# Patient Record
Sex: Female | Born: 1980 | Race: White | Hispanic: No | Marital: Single | Smoking: Current every day smoker
Health system: Southern US, Community
[De-identification: ages and names within clinical notes are randomized; demographics above are authoritative.]

## PROBLEM LIST (undated history)

## (undated) DIAGNOSIS — T7840XA Allergy, unspecified, initial encounter: Secondary | ICD-10-CM

## (undated) DIAGNOSIS — F1910 Other psychoactive substance abuse, uncomplicated: Secondary | ICD-10-CM

## (undated) DIAGNOSIS — M5481 Occipital neuralgia: Principal | ICD-10-CM

## (undated) DIAGNOSIS — F419 Anxiety disorder, unspecified: Secondary | ICD-10-CM

## (undated) HISTORY — DX: Other psychoactive substance abuse, uncomplicated: F19.10

## (undated) HISTORY — DX: Anxiety disorder, unspecified: F41.9

## (undated) HISTORY — DX: Occipital neuralgia: M54.81

## (undated) HISTORY — DX: Allergy, unspecified, initial encounter: T78.40XA

## (undated) HISTORY — PX: SKIN CANCER EXCISION: SHX779

## (undated) HISTORY — PX: CHOLECYSTECTOMY: SHX55

---

## 2001-03-18 ENCOUNTER — Other Ambulatory Visit: Admission: RE | Admit: 2001-03-18 | Discharge: 2001-03-18 | Payer: Self-pay | Admitting: Gynecology

## 2003-04-16 ENCOUNTER — Inpatient Hospital Stay (HOSPITAL_COMMUNITY): Admission: AD | Admit: 2003-04-16 | Discharge: 2003-04-16 | Payer: Self-pay | Admitting: Gynecology

## 2003-05-20 ENCOUNTER — Inpatient Hospital Stay (HOSPITAL_COMMUNITY): Admission: AD | Admit: 2003-05-20 | Discharge: 2003-05-20 | Payer: Self-pay | Admitting: Gynecology

## 2003-08-21 ENCOUNTER — Encounter (INDEPENDENT_AMBULATORY_CARE_PROVIDER_SITE_OTHER): Payer: Self-pay | Admitting: Specialist

## 2003-08-21 ENCOUNTER — Inpatient Hospital Stay (HOSPITAL_COMMUNITY): Admission: RE | Admit: 2003-08-21 | Discharge: 2003-08-24 | Payer: Self-pay | Admitting: Gynecology

## 2003-08-25 ENCOUNTER — Encounter: Admission: RE | Admit: 2003-08-25 | Discharge: 2003-09-24 | Payer: Self-pay | Admitting: Gynecology

## 2003-11-15 ENCOUNTER — Ambulatory Visit (HOSPITAL_COMMUNITY): Admission: RE | Admit: 2003-11-15 | Discharge: 2003-11-15 | Payer: Self-pay

## 2003-11-15 ENCOUNTER — Encounter (INDEPENDENT_AMBULATORY_CARE_PROVIDER_SITE_OTHER): Payer: Self-pay | Admitting: Specialist

## 2003-12-11 ENCOUNTER — Other Ambulatory Visit: Admission: RE | Admit: 2003-12-11 | Discharge: 2003-12-11 | Payer: Self-pay | Admitting: Gynecology

## 2004-04-23 ENCOUNTER — Emergency Department (HOSPITAL_COMMUNITY): Admission: EM | Admit: 2004-04-23 | Discharge: 2004-04-23 | Payer: Self-pay | Admitting: Family Medicine

## 2004-04-30 ENCOUNTER — Other Ambulatory Visit: Admission: RE | Admit: 2004-04-30 | Discharge: 2004-04-30 | Payer: Self-pay | Admitting: Gynecology

## 2004-08-15 ENCOUNTER — Other Ambulatory Visit (HOSPITAL_COMMUNITY): Admission: RE | Admit: 2004-08-15 | Discharge: 2004-09-16 | Payer: Self-pay | Admitting: Psychiatry

## 2005-01-14 ENCOUNTER — Emergency Department (HOSPITAL_COMMUNITY): Admission: EM | Admit: 2005-01-14 | Discharge: 2005-01-14 | Payer: Self-pay | Admitting: Emergency Medicine

## 2005-01-16 ENCOUNTER — Emergency Department: Payer: Self-pay | Admitting: General Practice

## 2005-03-03 ENCOUNTER — Emergency Department (HOSPITAL_COMMUNITY): Admission: EM | Admit: 2005-03-03 | Discharge: 2005-03-03 | Payer: Self-pay | Admitting: Emergency Medicine

## 2005-10-26 ENCOUNTER — Emergency Department (HOSPITAL_COMMUNITY): Admission: EM | Admit: 2005-10-26 | Discharge: 2005-10-26 | Payer: Self-pay | Admitting: Emergency Medicine

## 2005-12-17 ENCOUNTER — Emergency Department (HOSPITAL_COMMUNITY): Admission: EM | Admit: 2005-12-17 | Discharge: 2005-12-17 | Payer: Self-pay | Admitting: Family Medicine

## 2005-12-28 ENCOUNTER — Emergency Department (HOSPITAL_COMMUNITY): Admission: EM | Admit: 2005-12-28 | Discharge: 2005-12-28 | Payer: Self-pay | Admitting: Emergency Medicine

## 2006-02-01 ENCOUNTER — Emergency Department (HOSPITAL_COMMUNITY): Admission: EM | Admit: 2006-02-01 | Discharge: 2006-02-01 | Payer: Self-pay | Admitting: Family Medicine

## 2006-02-15 ENCOUNTER — Emergency Department (HOSPITAL_COMMUNITY): Admission: EM | Admit: 2006-02-15 | Discharge: 2006-02-15 | Payer: Self-pay | Admitting: Emergency Medicine

## 2006-06-10 ENCOUNTER — Emergency Department (HOSPITAL_COMMUNITY): Admission: EM | Admit: 2006-06-10 | Discharge: 2006-06-10 | Payer: Self-pay | Admitting: Emergency Medicine

## 2006-07-01 ENCOUNTER — Emergency Department: Payer: Self-pay | Admitting: General Practice

## 2006-08-02 ENCOUNTER — Emergency Department: Payer: Self-pay | Admitting: Emergency Medicine

## 2006-08-02 ENCOUNTER — Emergency Department (HOSPITAL_COMMUNITY): Admission: EM | Admit: 2006-08-02 | Discharge: 2006-08-02 | Payer: Self-pay | Admitting: Emergency Medicine

## 2007-01-21 ENCOUNTER — Ambulatory Visit: Payer: Self-pay | Admitting: Family Medicine

## 2007-01-21 LAB — CONVERTED CEMR LAB: Beta hcg, urine, semiquantitative: NEGATIVE

## 2007-02-02 ENCOUNTER — Encounter (INDEPENDENT_AMBULATORY_CARE_PROVIDER_SITE_OTHER): Payer: Self-pay | Admitting: Family Medicine

## 2010-04-16 ENCOUNTER — Other Ambulatory Visit (HOSPITAL_COMMUNITY): Payer: Self-pay | Admitting: *Deleted

## 2010-04-16 DIAGNOSIS — Z30430 Encounter for insertion of intrauterine contraceptive device: Secondary | ICD-10-CM

## 2010-04-23 ENCOUNTER — Ambulatory Visit (HOSPITAL_COMMUNITY): Admission: RE | Admit: 2010-04-23 | Payer: Self-pay | Source: Ambulatory Visit

## 2010-06-21 NOTE — Discharge Summary (Signed)
NAME:  Tamara Guerra, Tamara Guerra                         ACCOUNT NO.:  [id number]   MEDICAL RECORD NO.:  [id number]                   PATIENT TYPE:  INP   LOCATION:  9103                                 FACILITY:  [facility]   PHYSICIAN:  Ivor Costa. Farrel Gobble, M.D.              DATE OF BIRTH:  Oct 11, 1980   DATE OF ADMISSION:  08/21/2003  DATE OF DISCHARGE:  08/24/2003                                 DISCHARGE SUMMARY   PRINCIPAL DIAGNOSIS:  Thirty-seven-week pregnancy.   ADDITIONAL DIAGNOSES:  1. Breech.  2. Oligohydramnios.  3. Small for gestational age.  4. Contractions.   PRINCIPAL PROCEDURE:  Primary cesarean section.   HOSPITAL COURSE:  The patient is a 30 year old G3 P0 at 37 weeks who had  been followed in the office for SGA with low amniotic fluid who was seen in  the office on August 21, 2003 and found to have a fluid that had decreased  down to 4.  She was noted to be having contractions and the frank breech  presentation.  She had been scheduled for a cesarean section later in the  week.  It was electively done now on July 18.  The patient underwent a  primary cesarean section, low flap transverse, by Dr. Audie Box under spinal  anesthesia for delivery of a viable female, 5 pounds 14 ounces, with Apgars  of 8 and 9.  Postoperative course was unremarkable.  By postoperative day #3  the patient was tolerating a regular diet, she was ready for discharge, her  breastfeeding was going well, and she was without any complaints.  Her  abdomen was soft and nontender.  Her incision was clean and intact with  Steri-Strips.  Her uterus was firm, nontender, and below the umbilicus.  Her  postoperative labs:  Hemoglobin was 10.1 with a hematocrit of 29.9.   DISCHARGE CONDITION:  Stable.   The patient was discharged home with instructions to follow up in the office  6 weeks postpartum, no driving for the first 2 weeks.  She will take over-  the-counter Motrin.  Lortab 5 one to two q.4h. p.r.n. pain was  called into  the [facility].                                               [place] H. Farrel Gobble, M.D.    THL/MEDQ  D:  08/24/2003  T:  08/24/2003  Job:  [id number]

## 2010-06-21 NOTE — Op Note (Signed)
NAME:  Tamara Guerra, Tamara Guerra               ACCOUNT NO.:  [id number]   MEDICAL RECORD NO.:  [id number]          PATIENT TYPE:  AMB   LOCATION:  DAY                          FACILITY:  [facility]   PHYSICIAN:  Lorre Munroe., M.D.DATE OF BIRTH:  1980/08/21   DATE OF PROCEDURE:  11/15/2003  DATE OF DISCHARGE:                                 OPERATIVE REPORT   PREOPERATIVE DIAGNOSES:  Symptomatic cholelithiasis and chronic  cholecystitis.   POSTOPERATIVE DIAGNOSES:  Symptomatic cholelithiasis and chronic  cholecystitis.   OPERATION:  Laparoscopic cholecystectomy with operative cholangiogram.   SURGEON:  Dr. Orson Slick   ANESTHESIA:  General.   DESCRIPTION OF PROCEDURE:  After the patient was monitored and anesthetized  and had routine preparation and draping of the abdomen, I infiltrated local  anesthetic just below the umbilicus and made a short transverse incision,  dissected down through the fascia, opened it longitudinally, and bluntly  entered the peritoneum.  I then inserted a Hasson cannula which was secured  with a 0 Vicryl pursestring suture in the fascia and inflated the abdomen  with CO2.  I examined the pelvis and saw no abnormalities except one  adhesion of omentum to the anterior abdominal wall which I thought was  secondary to entrapment by her cesarean section scar.  The gallbladder  appeared to be slightly inflamed.  I then infiltrated local anesthetic in  three additional sites and put in a 10 mm epigastric port and two 5 mm right  abdominal ports.  With the patient positioned head-up, foot-down, and tilted  to the left, I retracted the fundus of the gallbladder toward the right  shoulder and took down adhesions of omentum to the undersurface of the  gallbladder.  I then grasped the infundibulum of the gallbladder and pulled  it to the right dissected out the cystic duct until I clearly identified the  cystic duct emerging from the infundibulum, and I could also see the  cystic  duct entering the common bile duct.  The cystic artery was also evident.  I  placed a single clip on the distal infundibulum of the gallbladder and made  a small opening in the cystic duct and inserted a cholangiogram catheter  held in place with a single clip, and performed a fluoroscopic cholangiogram  which showed normal sized ducts with normal anatomy, free flow into the  duodenum, and no evidence of stones in the bile ducts.  I then repositioned  the patient and removed the cholangiogram catheter and clipped the distal  cystic duct with three clips and divided it between the two closest to the  gallbladder.  I then clearly identified the cystic artery and clipped it  with three clips and divided between the two closest to the gallbladder.  I  then dissected the gallbladder from the liver using the hook dissector with  cautery and got hemostasis with the cautery.  After detaching the  gallbladder, I briefly irrigated the gallbladder fossa and operative area  and removed the small amount of irrigant.  I then removed the gallbladder  from the abdomen through the umbilical incision, and  it came out intact.  I  tied the pursestring  suture.  I did before completing the operation take down the adhesion in the  pelvis, and hemostasis appeared good.  I removed the lateral ports under  direct vision then allowed the CO2 to escape and remove the epigastric port.  The patient was stable through the procedure.      WB/MEDQ  D:  11/15/2003  T:  11/15/2003  Job:  [id number]   cc:   Brett Canales A. Cleta Alberts, M.D.  [address]  [place]  [place] [address]  Fax: [phone]

## 2010-06-21 NOTE — Op Note (Signed)
NAME:  LEATHA, ROHNER                         ACCOUNT NO.:  [id number]   MEDICAL RECORD NO.:  [id number]                   PATIENT TYPE:  INP   LOCATION:  9103                                 FACILITY:  [facility]   PHYSICIAN:  Timothy P. Fontaine, M.D.           DATE OF BIRTH:  [birth year]   DATE OF PROCEDURE:  08/21/2003  DATE OF DISCHARGE:                                 OPERATIVE REPORT   PREOPERATIVE DIAGNOSES:  Pregnancy at 37 weeks breech presentation, small  for gestational age, oligohydramnios, contractions.   POSTOPERATIVE DIAGNOSES:  Pregnancy at 37 weeks breech presentation, small  for gestational age, oligohydramnios, contractions.   PROCEDURE:  Primary low transverse cervical cesarean section.   SURGEON:  Timothy P. Fontaine, M.D.   ASSISTANT:  Scrub technician.   ANESTHESIA:  Spinal.   ESTIMATED BLOOD LOSS:  Less than 500 mL.   COMPLICATIONS:  None.   SPECIMENS:  Samples of cord blood, placenta.   FINDINGS:  At 41 normal female, weight 5 pounds 14 ounces.  Apgar's 8 & 9,  frank breech presentation, pelvic anatomy noted to be normal.   DESCRIPTION OF PROCEDURE:  The patient was taken to the operating room,  underwent spinal anesthesia, was placed in left tilt supine position,  received an abdominal preparation with Betadine solution, Foley catheter  placed in sterile technique and the patient was draped in the usual fashion.  After assuring adequate anesthesia, the abdomen was sharply entered through  a Pfannenstiel incision achieving adequate hemostasis through all levels.  The bladder flap was sharply and bluntly developed without difficulty. The  uterus was sharply entered in the lower uterine segment, bluntly extended  laterally.  The membranes were ruptured, the fluid noted to be clear, the  infant delivered in the frank breech presentation. The nares and mouth  suctioned, the cord doubly clamped and cut and the infant handed to  pediatrics in attendance.  Samples of cord blood were obtained, the placenta  was then spontaneously extruded, noted to be intact and was sent to  pathology. The uterus was exteriorized, the endometrial cavity explored with  the sponge to remove all placental membrane fragments. The patient received  1 g Ancef prophylaxis at this time.  The uterine incision was closed in one  layer using #0 Vicryl suture in a running interlocking stitch. Several  bleeding points were addressed using #0 Vicryl suture in figure-of-eight  interrupted suture.  The uterus was then returned to the abdomen which was  copiously irrigated showing adequate hemostasis and the anterior fascia was  then reapproximated using #0 Vicryl suture in a running stitch. The  subcutaneous tissue was irrigated, hemostasis  achieved with electrocautery, the skin reapproximated using 4-0 Vicryl in a  running subcuticular stitch. Benzoin and Steri-Strips applied, sterile  dressing applied. The patient was taken to the recovery room in good  condition having tolerated the procedure well.  Timothy P. Audie Box, M.D.    TPF/MEDQ  D:  08/21/2003  T:  08/22/2003  Job:  [id number]

## 2010-06-21 NOTE — H&P (Signed)
NAME:  Tamara Guerra, Tamara Guerra                         ACCOUNT NO.:  [id number]   MEDICAL RECORD NO.:  [id number]                   PATIENT TYPE:  INP   LOCATION:  9103                                 FACILITY:  [facility]   PHYSICIAN:  Timothy P. Fontaine, M.D.           DATE OF BIRTH:  10/25/1980   DATE OF ADMISSION:  08/21/2003  DATE OF DISCHARGE:                                HISTORY & PHYSICAL   CHIEF COMPLAINT:  1. Pregnancy at 37 weeks.  2. Breech presentation.  3. Oligohydramnios.  4. Small for gestational age.  5. Contractions.   HISTORY OF PRESENT ILLNESS:  A 30 year old G3 P0 AB2 female at 48 weeks  being followed for small for gestational age with low amniotic fluid.  The  date of admission the patient was noted to have an AFI of 4 and was having  contractions, was in the frank breech presentation, and is admitted at this  time for cesarean section.  For the remainder of her history, see her  [name].   PHYSICAL EXAMINATION:  HEENT:  Normal.  LUNGS:  Clear.  CARDIAC:  Regular rate without rubs, murmurs, or gallops.  ABDOMEN:  Gravid, breech presentation.  Palpates small for gestational age.  PELVIC:  Cervix is closed.   External monitor with a reactive fetal tracing.  Contractions every 5  minutes.   ASSESSMENT AND PLAN:  A 30 year old gravida 3 para 0 abortus 2 female at [redacted]  weeks gestation, low amniotic fluid index, small for gestational age, breech  presentation, with contractions, for primary cesarean section.  The risks,  benefits, indications, and alternatives for the procedure were reviewed with  the patient as documented in her outpatient record.  She was counseled as to  the risks of infection, antibiotic usage, abscess formation requiring  reoperation, drainage of abscesses, wound complications requiring opening  and draining of incisions and closure by secondary intention.  The risks of  hemorrhage necessitating transfusion and the risks of transfusion were  reviewed.  The risks of inadvertant injury to internal organs including  bowel, bladder, ureters, vessels, and nerves necessitating major exploratory  reparative surgeries and future reparative surgeries including ostomy  formation was all discussed, understood, and accepted.  The risks of fetal  injury during the birthing process was also reviewed and was accepted.  The  patient's beta strep is negative.                                               Timothy P. Audie Box, M.D.    TPF/MEDQ  D:  08/21/2003  T:  08/22/2003  Job:  [id number]

## 2011-01-09 ENCOUNTER — Ambulatory Visit (INDEPENDENT_AMBULATORY_CARE_PROVIDER_SITE_OTHER): Payer: Self-pay

## 2011-01-09 DIAGNOSIS — Z23 Encounter for immunization: Secondary | ICD-10-CM

## 2011-01-28 ENCOUNTER — Ambulatory Visit (INDEPENDENT_AMBULATORY_CARE_PROVIDER_SITE_OTHER): Payer: Self-pay

## 2011-01-28 DIAGNOSIS — Z202 Contact with and (suspected) exposure to infections with a predominantly sexual mode of transmission: Secondary | ICD-10-CM

## 2011-01-28 DIAGNOSIS — B9789 Other viral agents as the cause of diseases classified elsewhere: Secondary | ICD-10-CM

## 2011-01-28 DIAGNOSIS — R300 Dysuria: Secondary | ICD-10-CM

## 2011-05-14 ENCOUNTER — Other Ambulatory Visit: Payer: Self-pay | Admitting: Physician Assistant

## 2011-07-01 ENCOUNTER — Ambulatory Visit (INDEPENDENT_AMBULATORY_CARE_PROVIDER_SITE_OTHER): Payer: Self-pay | Admitting: Family Medicine

## 2011-07-01 ENCOUNTER — Ambulatory Visit: Payer: Self-pay | Admitting: Family Medicine

## 2011-07-01 VITALS — BP 123/87 | HR 109 | Temp 98.2°F | Resp 20 | Ht 66.0 in | Wt 130.0 lb

## 2011-07-01 DIAGNOSIS — R5383 Other fatigue: Secondary | ICD-10-CM

## 2011-07-01 DIAGNOSIS — N760 Acute vaginitis: Secondary | ICD-10-CM

## 2011-07-01 DIAGNOSIS — R55 Syncope and collapse: Secondary | ICD-10-CM

## 2011-07-01 DIAGNOSIS — Z0000 Encounter for general adult medical examination without abnormal findings: Secondary | ICD-10-CM

## 2011-07-01 LAB — TSH: TSH: 2.568 u[IU]/mL (ref 0.350–4.500)

## 2011-07-01 LAB — COMPREHENSIVE METABOLIC PANEL
ALT: 27 U/L (ref 0–35)
AST: 18 U/L (ref 0–37)
Albumin: 5.1 g/dL (ref 3.5–5.2)
Alkaline Phosphatase: 34 U/L — ABNORMAL LOW (ref 39–117)
BUN: 8 mg/dL (ref 6–23)
CO2: 27 mEq/L (ref 19–32)
Calcium: 10.2 mg/dL (ref 8.4–10.5)
Chloride: 102 mEq/L (ref 96–112)
Creat: 0.85 mg/dL (ref 0.50–1.10)
Glucose, Bld: 91 mg/dL (ref 70–99)
Potassium: 5.5 mEq/L — ABNORMAL HIGH (ref 3.5–5.3)
Sodium: 138 mEq/L (ref 135–145)
Total Bilirubin: 0.4 mg/dL (ref 0.3–1.2)
Total Protein: 7.8 g/dL (ref 6.0–8.3)

## 2011-07-01 LAB — POCT CBC
Granulocyte percent: 67 %G (ref 37–80)
HCT, POC: 44.7 % (ref 37.7–47.9)
Hemoglobin: 14.7 g/dL (ref 12.2–16.2)
Lymph, poc: 2.2 (ref 0.6–3.4)
MCH, POC: 30 pg (ref 27–31.2)
MCHC: 32.9 g/dL (ref 31.8–35.4)
MCV: 91.3 fL (ref 80–97)
MID (cbc): 0.4 (ref 0–0.9)
MPV: 10.1 fL (ref 0–99.8)
POC Granulocyte: 5.2 (ref 2–6.9)
POC LYMPH PERCENT: 28 %L (ref 10–50)
POC MID %: 5 %M (ref 0–12)
Platelet Count, POC: 321 10*3/uL (ref 142–424)
RBC: 4.9 M/uL (ref 4.04–5.48)
RDW, POC: 15.4 %
WBC: 7.8 10*3/uL (ref 4.6–10.2)

## 2011-07-01 LAB — POCT WET PREP WITH KOH
KOH Prep POC: NEGATIVE
RBC Wet Prep HPF POC: NEGATIVE
Trichomonas, UA: NEGATIVE
Yeast Wet Prep HPF POC: NEGATIVE

## 2011-07-01 LAB — HIV ANTIBODY (ROUTINE TESTING W REFLEX): HIV: NONREACTIVE

## 2011-07-01 LAB — POCT URINE PREGNANCY: Preg Test, Ur: NEGATIVE

## 2011-07-01 MED ORDER — METRONIDAZOLE 500 MG PO TABS: 500.0000 mg | ORAL_TABLET | Freq: Two times a day (BID) | ORAL | Status: AC

## 2011-07-01 NOTE — Progress Notes (Signed)
Subjective:    Patient ID: Tamara Guerra, female    DOB: 1980-09-30, 31 y.o.   MRN: [id number]  HPI  Patient presents for CPE  1) Addiction disorder- weaned off Suboxone 8 weeks ago. Attributes many symptoms since coming off Suboxone.                                     Hungry with food cravings  Headaches many evenings.fatigue and feeling tremulous.                                     Etoh last 1-2 weeks  4) Syncope- witnessed by boyfriend (?) without loss of consciousness; BS 71 upon EMS arrival.                       Had eaten prior to event both breakfast and lunch.  Had not  been feeling well all day; dizzy in particular.                        Nausea with emesis after event.                     Denies post ictal symptoms or loss of bowel bladder control.  5) Breakthrough bleeding associated with odor; has IUD(placed 5 years ago); H/O abnormal pap's in the past;                    Last pap 2 years ago and was normal.                    Has used OTC remedy for BV which has helped.      Review of Systems     Objective:   Physical Exam  Constitutional: She appears well-developed.  HENT:  Head: Normocephalic and atraumatic.  Mouth/Throat: No oropharyngeal exudate.       Ceruminosis (L) ear  Eyes: Conjunctivae and EOM are normal. Pupils are equal, round, and reactive to light.  Neck: Neck supple. No thyromegaly present.  Cardiovascular: Normal rate, regular rhythm and normal heart sounds.   Pulmonary/Chest: Effort normal and breath sounds normal.  Abdominal: Soft. Bowel sounds are normal.       No HSM  Musculoskeletal: Normal range of motion.  Neurological: She is alert. She has normal strength. No cranial nerve deficit or sensory deficit.  Reflex Scores:      Bicep reflexes are 2+ on the right side and 2+ on the left side.      Brachioradialis reflexes are 1+ on the right side and 1+ on the left side.      Patellar reflexes are 2+ on the right side and 2+ on the left  side. Skin: Skin is warm.       No eccymosis          Assessment & Plan:  A/ 1)Syncope; ?vagal  ?hypoglycemia ?dehydration secondary to ETOH        No evidence of seizure disorder; do not feel the need to proceed with CT/EEG.         -orthostatic by pulse             2) BV         3) DUB            -  question atrophic endometrium         4) Addiction disorder           -off Suboxone;; etoh ?becoming a concern   P/ Flagyl 500 mg BID X 7 days; stressed the importance of etoh cessation particularly with the use of Flagyl      Follow up pap and labs      Push fluids      Small frequent meals; avoid simple sugars

## 2011-07-02 LAB — RPR

## 2011-07-05 LAB — PAP IG, CT-NG, RFX HPV ASCU
Chlamydia Probe Amp: NEGATIVE
GC Probe Amp: NEGATIVE

## 2011-07-06 ENCOUNTER — Telehealth: Payer: Self-pay | Admitting: Family Medicine

## 2011-07-06 NOTE — Telephone Encounter (Signed)
LM for patient to return call.

## 2011-12-25 ENCOUNTER — Telehealth: Payer: Self-pay | Admitting: *Deleted

## 2011-12-25 MED ORDER — PAROXETINE HCL 40 MG PO TABS: 40.0000 mg | ORAL_TABLET | Freq: Every day | ORAL | Status: DC

## 2011-12-25 NOTE — Telephone Encounter (Signed)
Sent a month to the pharmacy but needs ov before more refills

## 2011-12-25 NOTE — Telephone Encounter (Signed)
Called pt, [facility] sent in and to recheck for additional refills

## 2011-12-25 NOTE — Telephone Encounter (Signed)
Pharmacy requesting refill on Paxil 40mg . Last refill 11/28/11.

## 2012-01-20 ENCOUNTER — Ambulatory Visit: Payer: Self-pay | Admitting: Internal Medicine

## 2012-01-20 ENCOUNTER — Other Ambulatory Visit: Payer: Self-pay | Admitting: Emergency Medicine

## 2012-01-20 DIAGNOSIS — F419 Anxiety disorder, unspecified: Secondary | ICD-10-CM

## 2012-01-20 MED ORDER — PAROXETINE HCL 40 MG PO TABS: 40.0000 mg | ORAL_TABLET | Freq: Every day | ORAL | Status: DC

## 2012-01-21 ENCOUNTER — Other Ambulatory Visit: Payer: Self-pay | Admitting: Emergency Medicine

## 2012-01-21 DIAGNOSIS — M545 Low back pain: Secondary | ICD-10-CM

## 2012-01-21 MED ORDER — MELOXICAM 7.5 MG PO TABS: ORAL_TABLET | ORAL | Status: DC

## 2012-01-21 MED ORDER — CYCLOBENZAPRINE HCL 10 MG PO TABS: ORAL_TABLET | ORAL | Status: DC

## 2012-02-06 ENCOUNTER — Ambulatory Visit: Payer: Self-pay | Admitting: Radiology

## 2012-02-06 DIAGNOSIS — Z23 Encounter for immunization: Secondary | ICD-10-CM

## 2012-02-10 ENCOUNTER — Other Ambulatory Visit: Payer: Self-pay | Admitting: Emergency Medicine

## 2012-02-10 ENCOUNTER — Telehealth: Payer: Self-pay | Admitting: Emergency Medicine

## 2012-02-10 DIAGNOSIS — J111 Influenza due to unidentified influenza virus with other respiratory manifestations: Secondary | ICD-10-CM

## 2012-02-10 MED ORDER — OSELTAMIVIR PHOSPHATE 75 MG PO CAPS: 75.0000 mg | ORAL_CAPSULE | Freq: Two times a day (BID) | ORAL | Status: DC

## 2012-02-10 NOTE — Telephone Encounter (Signed)
Patient called complaining of sore throat headache fever and myalgias. We'll call in prescription for Tamiflu 75 twice a day.

## 2012-05-11 ENCOUNTER — Other Ambulatory Visit: Payer: Self-pay | Admitting: Emergency Medicine

## 2012-05-11 DIAGNOSIS — F419 Anxiety disorder, unspecified: Secondary | ICD-10-CM

## 2012-05-11 MED ORDER — PAROXETINE HCL 40 MG PO TABS: 40.0000 mg | ORAL_TABLET | Freq: Every day | ORAL | Status: DC

## 2012-05-13 ENCOUNTER — Ambulatory Visit (INDEPENDENT_AMBULATORY_CARE_PROVIDER_SITE_OTHER): Payer: Self-pay | Admitting: Physician Assistant

## 2012-05-13 ENCOUNTER — Encounter: Payer: Self-pay | Admitting: Physician Assistant

## 2012-05-13 VITALS — BP 120/72 | HR 91 | Temp 99.0°F | Resp 16 | Ht 66.5 in | Wt 134.8 lb

## 2012-05-13 DIAGNOSIS — Z202 Contact with and (suspected) exposure to infections with a predominantly sexual mode of transmission: Secondary | ICD-10-CM

## 2012-05-13 DIAGNOSIS — Z0271 Encounter for disability determination: Secondary | ICD-10-CM

## 2012-05-13 DIAGNOSIS — F329 Major depressive disorder, single episode, unspecified: Secondary | ICD-10-CM | POA: Insufficient documentation

## 2012-05-13 DIAGNOSIS — Z20828 Contact with and (suspected) exposure to other viral communicable diseases: Secondary | ICD-10-CM

## 2012-05-13 DIAGNOSIS — K121 Other forms of stomatitis: Secondary | ICD-10-CM

## 2012-05-13 DIAGNOSIS — G4700 Insomnia, unspecified: Secondary | ICD-10-CM

## 2012-05-13 DIAGNOSIS — F5104 Psychophysiologic insomnia: Secondary | ICD-10-CM

## 2012-05-13 DIAGNOSIS — F341 Dysthymic disorder: Secondary | ICD-10-CM

## 2012-05-13 DIAGNOSIS — R0609 Other forms of dyspnea: Secondary | ICD-10-CM

## 2012-05-13 DIAGNOSIS — F419 Anxiety disorder, unspecified: Secondary | ICD-10-CM | POA: Insufficient documentation

## 2012-05-13 DIAGNOSIS — R5383 Other fatigue: Secondary | ICD-10-CM

## 2012-05-13 DIAGNOSIS — R5381 Other malaise: Secondary | ICD-10-CM

## 2012-05-13 DIAGNOSIS — R0600 Dyspnea, unspecified: Secondary | ICD-10-CM

## 2012-05-13 DIAGNOSIS — F909 Attention-deficit hyperactivity disorder, unspecified type: Secondary | ICD-10-CM | POA: Insufficient documentation

## 2012-05-13 LAB — POCT URINALYSIS DIPSTICK
Bilirubin, UA: NEGATIVE
Blood, UA: NEGATIVE
Glucose, UA: NEGATIVE
Ketones, UA: NEGATIVE
Leukocytes, UA: NEGATIVE
Nitrite, UA: NEGATIVE
Protein, UA: NEGATIVE
Spec Grav, UA: 1.02
Urobilinogen, UA: 0.2
pH, UA: 7.5

## 2012-05-13 LAB — GLUCOSE, POCT (MANUAL RESULT ENTRY): POC Glucose: 72 mg/dL (ref 70–99)

## 2012-05-13 MED ORDER — CLOTRIMAZOLE 10 MG MT TROC: 10.0000 mg | Freq: Every day | OROMUCOSAL | Status: DC

## 2012-05-13 NOTE — Patient Instructions (Signed)
Work on getting good sleep and making healthy eating choices.  Don't skip meals.  Take small snacks with you to work that you can eat "on the go."  Get some regular activity daily, and if you do it as you begin to feel your energy wane, it can help boost you.

## 2012-05-13 NOTE — Progress Notes (Signed)
Subjective:    Patient ID: Tamara Guerra, female    DOB: 10/28/1980, 32 y.o.   MRN: [id number]  HPI  This 32 y.o. female presents for evaluation of fatigue.  Several months of "extreme fatigue and general malaise."  Some dizziness.  Heart beats really fast with standing up sometimes. "Like I'm working through a fog." Takes longer to perform usual tasks.  Some HA in the evening.  Sleep problems initially, but for the past 1-2 weeks has been sleeping well again, but without resolution.  Hasn't identified source of increased stress, but feels more emotional.  She's not been eating well-often just drinking sodas all day without any food.  Then she feels bad after eating a meal in the evening.  Not exercising at all.  Mouth soreness-"I think I have thrush again."  Treated last fall, but didn't complete the treatment, and finds her symptoms come and go. Desires HIV testing.  Last test was 06/2011.  Some shortness of breath x a couple of months, "Like I'm not getting as much air as I should.  And sometimes by chest hurst."  Worse with dog hair and dander on her clothes after work.  No cough.  Occasional wheezing.  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 She reports 5 sexual partners in the past 12 months, though is now monogamous with one partner who has just moved in with her.  Patient Active Problem List  Diagnosis  . Anxiety and depression  . Chronic insomnia  . ADHD (attention deficit hyperactivity disorder)    Past Medical History  Diagnosis Date  . Allergy   . Substance abuse   . Anxiety     Past Surgical History  Procedure Laterality Date  . Cholecystectomy    . Skin cancer excision      stage 2 malignant melanoma  . Cesarean section      Prior to Admission medications   Medication Sig Start Date End Date Taking? Authorizing Provider   amphetamine-dextroamphetamine (ADDERALL) 20 MG tablet Take 20 mg by mouth 3 (three) times daily.   Yes Historical Provider, MD  Multiple Vitamin (MULTIVITAMIN) capsule Take 1 capsule by mouth daily.   Yes Historical Provider, MD  PARoxetine (PAXIL) 40 MG tablet Take 1 tablet (40 mg total) by mouth daily. 05/11/12  Yes Collene Gobble, MD  traZODone (DESYREL) 100 MG tablet Take 100 mg by mouth at bedtime.   Yes Historical Provider, MD  MELATONIN ER PO Take by mouth.    Historical Provider, MD    Allergies  Allergen Reactions  . Ceclor (Cefaclor) Anaphylaxis    History   Social History  . Marital Status: Single    Spouse Name: N/A    Number of Children: 1  . Years of Education: 16   Occupational History  . Dog Groomer    Social History Main Topics  . Smoking status: Current Every Day Smoker -- 1.00 packs/day    Types: Cigarettes  . Smokeless tobacco: Never Used     Comment: trying nicotine replacement gum and e-cig to try to cut back  . Alcohol Use: Not on file  . Drug Use: Not on file     Comment: Stopped Suboxone 04/2011 after 4 years of traetment   . Sexually Active: Yes    Birth Control/ Protection: IUD     Comment: Paragard   Other Topics Concern  .  Not on file   Social History Narrative   Long-term relationship with one partner, plus 4 others in the past year (2014).  Shares custody of her daughter with her ex-husband.  Lots of family support.    Family History  Problem Relation Age of Onset  . Sarcoidosis Mother     neurosarcoidosis  . Hypertension Father   . Hyperlipidemia Father      Review of Systems As above. Frequent BV (post-coital and post-menstrual) treated with a homeopathic product that works well, when she can afford it.    Objective:   Physical Exam Blood pressure 120/72, pulse 91, temperature 99 F (37.2 C), temperature source Oral, resp. rate 16, height 5' 6.5" (1.689 m), weight 134 lb 12.8 oz (61.145 kg), last menstrual period 04/29/2012, SpO2  98.00%. Body mass index is 21.43 kg/(m^2). Well-developed, well nourished WF who is awake, alert and oriented, in NAD. HEENT: [place]/AT, PERRL, EOMI.  Sclera and conjunctiva are clear.  EAC are patent, TMs are normal in appearance. Nasal mucosa is pink and moist. OP is clear. No lesions of the tongue, buccal or gingival surfaces. Neck: supple, non-tender, no lymphadenopathy, thyromegaly. Heart: RRR, no murmur Lungs: normal effort, CTA Abdomen: normo-active bowel sounds, supple, non-tender, no mass or organomegaly. Extremities: no cyanosis, clubbing or edema. Skin: warm and dry without rash. Psychologic: good mood and appropriate affect, normal speech and behavior.    Results for orders placed in visit on 05/13/12  GLUCOSE, POCT (MANUAL RESULT ENTRY)      Result Value Range   POC Glucose 72  70 - 99 mg/dl  POCT URINALYSIS DIPSTICK      Result Value Range   Color, UA yellow     Clarity, UA clear     Glucose, UA neg     Bilirubin, UA neg     Ketones, UA neg     Spec Grav, UA 1.020     Blood, UA neg     pH, UA 7.5     Protein, UA neg     Urobilinogen, UA 0.2     Nitrite, UA neg     Leukocytes, UA Negative         Assessment & Plan:  Fatigue - Plan: POCT glucose (manual entry), CBC with Differential, Comprehensive metabolic panel, TSH, Vitamin D 25 hydroxy, Epstein-Barr virus VCA antibody panel, HIV antibody, POCT urinalysis dipstick; she's advised to make healthier eating choices, get regular exercise, etc.  Stomatitis - Plan: POCT glucose (manual entry), CBC with Differential, Epstein-Barr virus VCA antibody panel, HIV antibody; Clotrimazole Troche 10 mg  Anxiety and depression - stable.  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  Await labs.  Anxiety is likely also contributing.  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  Trazodone controls this well most of the time.  ADHD (attention deficit hyperactivity disorder) - stable.  Continue current treatment.  Fernande Bras, PA-C Certified Physician Assistant  [facility]

## 2012-05-14 LAB — CBC WITH DIFFERENTIAL/PLATELET
Basophils Absolute: 0 10*3/uL (ref 0.0–0.1)
Basophils Relative: 0 % (ref 0–1)
Eosinophils Absolute: 0.1 10*3/uL (ref 0.0–0.7)
Eosinophils Relative: 1 % (ref 0–5)
HCT: 37.4 % (ref 36.0–46.0)
Hemoglobin: 13.6 g/dL (ref 12.0–15.0)
Lymphocytes Relative: 19 % (ref 12–46)
Lymphs Abs: 2.3 10*3/uL (ref 0.7–4.0)
MCH: 32 pg (ref 26.0–34.0)
MCHC: 36.4 g/dL — ABNORMAL HIGH (ref 30.0–36.0)
MCV: 88 fL (ref 78.0–100.0)
Monocytes Absolute: 0.6 10*3/uL (ref 0.1–1.0)
Monocytes Relative: 5 % (ref 3–12)
Neutro Abs: 8.8 10*3/uL — ABNORMAL HIGH (ref 1.7–7.7)
Neutrophils Relative %: 75 % (ref 43–77)
Platelets: 282 10*3/uL (ref 150–400)
RBC: 4.25 MIL/uL (ref 3.87–5.11)
RDW: 13.4 % (ref 11.5–15.5)
WBC: 11.8 10*3/uL — ABNORMAL HIGH (ref 4.0–10.5)

## 2012-05-14 LAB — HIV ANTIBODY (ROUTINE TESTING W REFLEX): HIV: NONREACTIVE

## 2012-05-14 LAB — EPSTEIN-BARR VIRUS VCA ANTIBODY PANEL
EBV EA IgG: <5 U/mL (ref <9.0)
EBV NA IgG: 526 U/mL — ABNORMAL HIGH (ref <18.0)
EBV VCA IgG: 268 U/mL — ABNORMAL HIGH (ref <18.0)
EBV VCA IgM: <10 U/mL (ref <36.0)

## 2012-05-14 LAB — COMPREHENSIVE METABOLIC PANEL
ALT: 17 U/L (ref 0–35)
AST: 19 U/L (ref 0–37)
Albumin: 4.4 g/dL (ref 3.5–5.2)
Alkaline Phosphatase: 38 U/L — ABNORMAL LOW (ref 39–117)
BUN: 17 mg/dL (ref 6–23)
CO2: 30 mEq/L (ref 19–32)
Calcium: 9.8 mg/dL (ref 8.4–10.5)
Chloride: 98 mEq/L (ref 96–112)
Creat: 0.79 mg/dL (ref 0.50–1.10)
Glucose, Bld: 71 mg/dL (ref 70–99)
Potassium: 3.8 mEq/L (ref 3.5–5.3)
Sodium: 136 mEq/L (ref 135–145)
Total Bilirubin: 0.3 mg/dL (ref 0.3–1.2)
Total Protein: 7.4 g/dL (ref 6.0–8.3)

## 2012-05-14 LAB — HSV 2 ANTIBODY, IGG: HSV 2 Glycoprotein G Ab, IgG: <0.1 IV

## 2012-05-14 LAB — VITAMIN D 25 HYDROXY (VIT D DEFICIENCY, FRACTURES): Vit D, 25-Hydroxy: 36 ng/mL (ref 30–89)

## 2012-05-14 LAB — TSH: TSH: 1.284 u[IU]/mL (ref 0.350–4.500)

## 2012-10-06 ENCOUNTER — Other Ambulatory Visit: Payer: Self-pay | Admitting: Emergency Medicine

## 2012-10-06 ENCOUNTER — Telehealth: Payer: Self-pay | Admitting: Emergency Medicine

## 2012-10-06 DIAGNOSIS — J209 Acute bronchitis, unspecified: Secondary | ICD-10-CM

## 2012-10-06 MED ORDER — DOXYCYCLINE HYCLATE 100 MG PO CAPS: 100.0000 mg | ORAL_CAPSULE | Freq: Two times a day (BID) | ORAL | Status: DC

## 2012-10-06 NOTE — Telephone Encounter (Signed)
Patient called complaining of chest congestion cough productive of a greenish type phlegm with metallic taste. She is a smoker. Daughter recently treated for pneumonia. We'll treat with doxycycline x10 days.

## 2012-10-09 ENCOUNTER — Telehealth: Payer: Self-pay

## 2012-10-09 NOTE — Telephone Encounter (Signed)
Pt called and is still having a productive cough. Z Pak sent in per Dr Cleta Alberts. Pt notified.

## 2012-12-09 ENCOUNTER — Other Ambulatory Visit: Payer: Self-pay

## 2013-02-15 ENCOUNTER — Other Ambulatory Visit: Payer: Self-pay | Admitting: Emergency Medicine

## 2013-02-15 ENCOUNTER — Telehealth: Payer: Self-pay | Admitting: Emergency Medicine

## 2013-02-15 MED ORDER — CYCLOBENZAPRINE HCL 10 MG PO TABS: 10.0000 mg | ORAL_TABLET | Freq: Every day | ORAL | Status: DC

## 2013-02-15 NOTE — Telephone Encounter (Signed)
Will call in flexeril for neck spasm

## 2013-02-16 ENCOUNTER — Ambulatory Visit: Payer: Self-pay

## 2013-02-16 ENCOUNTER — Ambulatory Visit: Payer: Self-pay | Admitting: Family Medicine

## 2013-02-16 VITALS — BP 130/80 | HR 107 | Temp 97.9°F | Resp 18 | Ht 67.0 in | Wt 144.0 lb

## 2013-02-16 DIAGNOSIS — M542 Cervicalgia: Secondary | ICD-10-CM

## 2013-02-16 DIAGNOSIS — M5412 Radiculopathy, cervical region: Secondary | ICD-10-CM

## 2013-02-16 MED ORDER — MELOXICAM 7.5 MG PO TABS: 7.5000 mg | ORAL_TABLET | Freq: Every day | ORAL | Status: DC

## 2013-02-16 NOTE — Progress Notes (Addendum)
Subjective:    Patient ID: Tamara Guerra, female    DOB: December 23, 1980, 33 y.o.   MRN: [id number]  This chart was scribed for Shade Flood, MD by Blanchard Kelch, ED Scribe. The patient was seen in room 12. Patient's care was started at 1:10 PM.  Chief Complaint  Patient presents with  . Back Pain    x 3 days  . Numbness    pinky finger (R) hand    PCP: COPLAND,JESSICA, MD   HPI  Tamara Guerra is a 33 y.o. female who presents to office complaining of right upper back pain that began two days ago. She states the pain worst in the morning upon waking and gradually subsides throughout the day. The pain is worsened with moving her neck back and foreward She reports associated tingling in her 4th and 5th digit, worse in her 5th. She has been taking two 200 mg Ibuprofen every 4-6 hours with mild relief. She also iced the area last night. She states that she has a history of similar pain that occurred last summer, but states this episode is much more severe. She was prescribed Flexeril by her father the last time the pain occurred, and was also using ice and traction, with gradual relief of pain after six to eight weeks. She also had the numbness in her right fingers, but also states that they became weak, which she has not experienced with this episode. She denies shortness of breath, chest pain, or rash.  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 She believes this could have exacerbated the pain as this is her first full job in about two years. She has a history of narcotic abuse in the past and would not like any for the pain. She has a copper IUD in place and denies any chance she could be pregnant.    Patient Active Problem List   Diagnosis Date Noted  . Anxiety and depression 05/13/2012  . Chronic insomnia 05/13/2012  . ADHD (attention deficit hyperactivity disorder) 05/13/2012   Past Medical History  Diagnosis Date  . Allergy     . Substance abuse   . Anxiety    Past Surgical History  Procedure Laterality Date  . Cholecystectomy    . Skin cancer excision      stage 2 malignant melanoma  . Cesarean section     Allergies  Allergen Reactions  . Ceclor [Cefaclor] Anaphylaxis  . Doxycycline Other (See Comments) and Hypertension    Dizziness   Prior to Admission medications   Medication Sig Start Date End Date Taking? Authorizing Provider  amphetamine-dextroamphetamine (ADDERALL) 20 MG tablet Take 20 mg by mouth 3 (three) times daily.   Yes Historical Provider, MD  cyclobenzaprine (FLEXERIL) 10 MG tablet Take 1 tablet (10 mg total) by mouth at bedtime. 02/15/13  Yes Collene Gobble, MD  Multiple Vitamin (MULTIVITAMIN) capsule Take 1 capsule by mouth daily.   Yes Historical Provider, MD  PARoxetine (PAXIL) 40 MG tablet Take 1 tablet (40 mg total) by mouth daily. 05/11/12  Yes Collene Gobble, MD  traZODone (DESYREL) 100 MG tablet Take 100 mg by mouth at bedtime.   Yes Historical Provider, MD  clotrimazole (MYCELEX) 10 MG troche Take 1 tablet (10 mg total) by mouth 5 (five) times daily. 05/13/12   Chelle S Jeffery, PA-C  MELATONIN ER PO Take by mouth.    Historical Provider, MD  History   Social History  . Marital Status: Single    Spouse Name: n/a    Number of Children: 1  . Years of Education: 16   Occupational History  . Dog Groomer    Social History Main Topics  . Smoking status: Current Every Day Smoker -- 1.00 packs/day    Types: Cigarettes  . Smokeless tobacco: Never Used     Comment: trying nicotine replacement gum and e-cig to try to cut back  . Alcohol Use: Not on file  . Drug Use: Not on file     Comment: Stopped Suboxone 04/2011 after 4 years of traetment   . Sexual Activity: Yes    Birth Control/ Protection: IUD     Comment: Paragard   Other Topics Concern  . Not on file   Social History Narrative   Lives with her daughter and boyfriend.  Shares custody of her daughter with her ex-husband.      Lots of family support. Her father is a family physician at [employer], mother is an Engineer, structural and also lives locally.  She is the oldest of three siblings.  Her sister is an OT, married with one daughter and lives in [place].  Her brother lives with their father locally.    Review of Systems  Constitutional: Negative for fever.  HENT: Negative for drooling.   Eyes: Negative for discharge.  Respiratory: Negative for cough and shortness of breath.   Gastrointestinal: Negative for vomiting.  Endocrine: Negative for polyuria.  Genitourinary: Negative for hematuria.  Musculoskeletal: Positive for back pain. Negative for gait problem.  Skin: Negative for rash.  Allergic/Immunologic: Negative for immunocompromised state.  Neurological: Positive for numbness. Negative for speech difficulty and weakness.  Hematological: Negative for adenopathy.  Psychiatric/Behavioral: Negative for confusion.       Objective:   Physical Exam  Nursing note and vitals reviewed. Constitutional: She is oriented to person, place, and time. She appears well-developed and well-nourished. No distress.  HENT:  Head: Normocephalic and atraumatic.  Eyes: EOM are normal.  Neck: Neck supple. No tracheal deviation present.  Full ROM c spine. Pain with flexion and extension.  Cardiovascular: Normal rate.   Pulmonary/Chest: Effort normal. No respiratory distress.  Musculoskeletal: Normal range of motion.  Full ROM and strength in right shoulder. Full grip strength right hand. Right paraspinal into upper rhomboid tenderness to palpation. No midline or bony spinal tenderness. No winging of the scapula.  Neurological: She is alert and oriented to person, place, and time.  Reflex Scores:      Tricep reflexes are 1+ on the right side and 2+ on the left side.      Bicep reflexes are 2+ on the right side and 2+ on the left side.      Brachioradialis reflexes are 2+ on the right side and 2+ on the left side. Skin: Skin is warm  and dry.  Psychiatric: She has a normal mood and affect. Her behavior is normal.     Filed Vitals:   02/16/13 1246  BP: 130/80  Pulse: 107  Temp: 97.9 F (36.6 C)  TempSrc: Oral  Resp: 18  Height: 5\' 7"  (1.702 m)  Weight: 144 lb (65.318 kg)  SpO2: 100%    [facility] reading (PRIMARY) by Dr. Neva Seat: cervical spine (2 views)- Decreased cervical lordosis with minimal spondylosis. No acute findings.      Assessment & Plan:   Tamara Guerra is a 33 y.o. female Neck pain - Plan: DG Cervical Spine 2  or 3 views, meloxicam (MOBIC) 7.5 MG tablet  Cervical radiculopathy at C8 - Plan: meloxicam (MOBIC) 7.5 MG tablet  Suspected degenerative dz. vs spasm and secondary C8 +/- 7 radiculopathy on right. Full strength. Options discussed. Will try flexeril as already prescribed (up to tid, but SED), add Mobic 1-2 QD prn, ice and rom, then HEP if improving by neck care manual given in office. If not improving into next week, consider short course of prednisone - precautions and side effects of this were discussed in office today. rtc sooner if worse.   Meds ordered this encounter  Medications  . meloxicam (MOBIC) 7.5 MG tablet    Sig: Take 1-2 tablets (7.5-15 mg total) by mouth daily.    Dispense:  30 tablet    Refill:  0   Patient Instructions  You can increase the flexeril to up to 3 times per day, but this will cause sedation.  Start meloxicam 1 to 2 each day - do not combine with other antiinflammatories. Ice, stim unit as discussed, range of motion.  If not improving into next week, call me and we an try a short course of prednisone. Return to the clinic or go to the nearest emergency room if any of your symptoms worsen or new symptoms occur.  Once current symptoms improve - can start exercises as in neck care manual.

## 2013-02-16 NOTE — Patient Instructions (Signed)
You can increase the flexeril to up to 3 times per day, but this will cause sedation.  Start meloxicam 1 to 2 each day - do not combine with other antiinflammatories. Ice, stim unit as discussed, range of motion.  If not improving into next week, call me and we an try a short course of prednisone. Return to the clinic or go to the nearest emergency room if any of your symptoms worsen or new symptoms occur.  Once current symptoms improve - can start exercises as in neck care manual.

## 2013-02-22 ENCOUNTER — Telehealth: Payer: Self-pay | Admitting: Emergency Medicine

## 2013-02-22 ENCOUNTER — Other Ambulatory Visit: Payer: Self-pay | Admitting: Emergency Medicine

## 2013-02-22 DIAGNOSIS — J329 Chronic sinusitis, unspecified: Secondary | ICD-10-CM

## 2013-02-22 MED ORDER — AZITHROMYCIN 250 MG PO TABS: ORAL_TABLET | ORAL | Status: DC

## 2013-02-22 NOTE — Telephone Encounter (Signed)
Patient was reports increased sinus congestion purulent nasal drainage and productive cough. Patient requesting a Z-Pak which will be called in.

## 2013-03-14 ENCOUNTER — Other Ambulatory Visit: Payer: Self-pay | Admitting: Physician Assistant

## 2013-03-14 MED ORDER — TRAZODONE HCL 100 MG PO TABS: 100.0000 mg | ORAL_TABLET | Freq: Every day | ORAL | Status: AC

## 2013-03-26 ENCOUNTER — Ambulatory Visit: Payer: Self-pay | Admitting: Physician Assistant

## 2013-03-26 VITALS — BP 108/84 | HR 90 | Temp 98.2°F | Resp 16 | Ht 66.5 in | Wt 141.0 lb

## 2013-03-26 DIAGNOSIS — R300 Dysuria: Secondary | ICD-10-CM

## 2013-03-26 DIAGNOSIS — N898 Other specified noninflammatory disorders of vagina: Secondary | ICD-10-CM

## 2013-03-26 LAB — POCT URINALYSIS DIPSTICK
Bilirubin, UA: NEGATIVE
Blood, UA: NEGATIVE
Glucose, UA: NEGATIVE
Ketones, UA: NEGATIVE
Leukocytes, UA: NEGATIVE
Nitrite, UA: NEGATIVE
Protein, UA: NEGATIVE
Spec Grav, UA: 1.015
Urobilinogen, UA: 0.2
pH, UA: 7

## 2013-03-26 LAB — POCT WET PREP WITH KOH
KOH PREP POC: NEGATIVE
TRICHOMONAS UA: NEGATIVE
YEAST WET PREP PER HPF POC: NEGATIVE

## 2013-03-26 LAB — POCT UA - MICROSCOPIC ONLY
Bacteria, U Microscopic: NEGATIVE
CASTS, UR, LPF, POC: NEGATIVE
Crystals, Ur, HPF, POC: NEGATIVE
Mucus, UA: NEGATIVE
RBC, urine, microscopic: NEGATIVE
Yeast, UA: NEGATIVE

## 2013-03-26 MED ORDER — METRONIDAZOLE 500 MG PO TABS: 500.0000 mg | ORAL_TABLET | Freq: Two times a day (BID) | ORAL | Status: DC

## 2013-03-26 MED ORDER — FLUCONAZOLE 150 MG PO TABS: 150.0000 mg | ORAL_TABLET | Freq: Once | ORAL | Status: DC

## 2013-03-26 NOTE — Progress Notes (Signed)
Subjective:    Patient ID: Tamara Guerra, female    DOB: 26-Nov-1980, 33 y.o.   MRN: [id number]   PCP: Abbe Amsterdam, MD  Chief Complaint  Patient presents with  . Exposure to STD    Medications, allergies, past medical history, surgical history, family history, social history and problem list reviewed and updated.  HPI Presents concerned that she may have an STI.  No known exposure, but has concerns. "I have a UTI and I have BV."  1. Swollen lymph node on the LEFT, just below the angle of the jaw.  Has a bad tooth on that side.  Has lots of allergy symptoms.  Took a Zpak, and it got smaller.  Not painful.  2. During a brief break from their relationship, her boyfriend had unprotected sex with another partner. Dysuria began about 2 weeks ago, 3-5 days after resuming sex with her boyfriend. She has noticed a new vaginal discharge and odor, but isn't sure it's not what she normally gets since she's had an IUD. Intermittent pelvic pain, breakthrough bleeding and vaginal D/C.  Self treats monthly with a holistic product.   No fever, chills, nausea, vomiting, new back pain or pelvic pain.   Review of Systems As above.    Objective:   Physical Exam BP 162/70  Pulse 90  Temp(Src) 98.2 F (36.8 C) (Oral)  Resp 16  Ht 5' 6.5" (1.689 m)  Wt 141 lb (63.957 kg)  BMI 22.42 kg/m2  SpO2 92% Repeat BP at the end of the visit was 108/88.  WDWNWF, A&O x 3. Normal respiratory effort. No palpable lymphadenopathy in the neck.  She is also unable to locate the node of concern. Skin is warm and dry. Vaginal specimens are self-collected.  Results for orders placed in visit on 03/26/13  POCT UA - MICROSCOPIC ONLY      Result Value Ref Range   WBC, Ur, HPF, POC 0-1     RBC, urine, microscopic neg     Bacteria, U Microscopic neg     Mucus, UA neg     Epithelial cells, urine per micros 0-1     Crystals, Ur, HPF, POC neg     Casts, Ur, LPF, POC neg     Yeast, UA neg    POCT URINALYSIS  DIPSTICK      Result Value Ref Range   Color, UA yellow     Clarity, UA clear     Glucose, UA neg     Bilirubin, UA neg     Ketones, UA neg     Spec Grav, UA 1.015     Blood, UA neg     pH, UA 7.0     Protein, UA neg     Urobilinogen, UA 0.2     Nitrite, UA neg     Leukocytes, UA Negative    POCT WET PREP WITH KOH      Result Value Ref Range   Trichomonas, UA Negative     Clue Cells Wet Prep HPF POC 1-2     Epithelial Wet Prep HPF POC 5-10     Yeast Wet Prep HPF POC neg     Bacteria Wet Prep HPF POC 4+     RBC Wet Prep HPF POC tntc     WBC Wet Prep HPF POC 0-4     KOH Prep POC Negative         Assessment & Plan:  1. Vaginal discharge Cover for non-specific vaginosis and await GC/CT  results. Advise HIV testing in 3 months. - POCT Wet Prep with KOH - GC/Chlamydia Probe Amp - metroNIDAZOLE (FLAGYL) 500 MG tablet; Take 1 tablet (500 mg total) by mouth 2 (two) times daily with a meal. DO NOT CONSUME ALCOHOL WHILE TAKING THIS MEDICATION.  Dispense: 14 tablet; Refill: 0 - fluconazole (DIFLUCAN) 150 MG tablet; Take 1 tablet (150 mg total) by mouth once. Repeat if needed  Dispense: 2 tablet; Refill: 0  2. Dysuria Likely due to non-specific vaginosis.  No evidence of infection.  If symptoms continue, reassess. - POCT UA - Microscopic Only - POCT urinalysis dipstick  Reassured that I feel no lymphadenopathy today.  She'll continue to monitor it and present for re-evaluation as needed.  Fernande Brashelle S. Charlann Wayne, PA-C Physician Assistant-Certified [facility]

## 2013-03-26 NOTE — Patient Instructions (Signed)
Only take the diflucan if you continue to have symptoms after completing the metronidazole. Return in 3 months for HIV testing.

## 2013-03-28 LAB — GC/CHLAMYDIA PROBE AMP
CT PROBE, AMP APTIMA: NEGATIVE
GC Probe RNA: NEGATIVE

## 2013-05-17 ENCOUNTER — Other Ambulatory Visit: Payer: Self-pay | Admitting: Emergency Medicine

## 2013-05-17 DIAGNOSIS — F419 Anxiety disorder, unspecified: Secondary | ICD-10-CM

## 2013-05-17 MED ORDER — PAROXETINE HCL 40 MG PO TABS: 40.0000 mg | ORAL_TABLET | Freq: Every day | ORAL | Status: AC

## 2013-05-18 ENCOUNTER — Other Ambulatory Visit: Payer: Self-pay | Admitting: Family Medicine

## 2013-05-18 DIAGNOSIS — R109 Unspecified abdominal pain: Secondary | ICD-10-CM

## 2013-05-18 DIAGNOSIS — R197 Diarrhea, unspecified: Secondary | ICD-10-CM

## 2013-05-19 ENCOUNTER — Other Ambulatory Visit: Payer: Self-pay

## 2013-05-19 DIAGNOSIS — R109 Unspecified abdominal pain: Secondary | ICD-10-CM

## 2013-05-19 DIAGNOSIS — R197 Diarrhea, unspecified: Secondary | ICD-10-CM

## 2013-05-19 LAB — CLOSTRIDIUM DIFFICILE EIA: CDIFTX: NEGATIVE

## 2013-05-19 NOTE — Progress Notes (Signed)
Specimen brought in for labs only

## 2013-05-20 LAB — FECAL LACTOFERRIN, QUANT: LACTOFERRIN: NEGATIVE

## 2013-05-20 LAB — OVA AND PARASITE EXAMINATION: OP: NONE SEEN

## 2013-05-20 LAB — GIARDIA/CRYPTOSPORIDIUM (EIA)
Cryptosporidium Screen (EIA): NEGATIVE
Giardia Screen (EIA): NEGATIVE

## 2013-05-22 ENCOUNTER — Other Ambulatory Visit: Payer: Self-pay | Admitting: Radiology

## 2013-05-22 DIAGNOSIS — R197 Diarrhea, unspecified: Secondary | ICD-10-CM

## 2013-05-23 LAB — STOOL CULTURE

## 2013-12-28 ENCOUNTER — Ambulatory Visit (INDEPENDENT_AMBULATORY_CARE_PROVIDER_SITE_OTHER): Payer: Self-pay | Admitting: Physician Assistant

## 2013-12-28 DIAGNOSIS — Z20818 Contact with and (suspected) exposure to other bacterial communicable diseases: Secondary | ICD-10-CM

## 2013-12-28 DIAGNOSIS — Z2089 Contact with and (suspected) exposure to other communicable diseases: Secondary | ICD-10-CM

## 2013-12-28 DIAGNOSIS — Z23 Encounter for immunization: Secondary | ICD-10-CM

## 2013-12-28 MED ORDER — AMOXICILLIN 875 MG PO TABS: 875.0000 mg | ORAL_TABLET | Freq: Two times a day (BID) | ORAL | Status: AC

## 2013-12-28 NOTE — Progress Notes (Signed)
Presents for flu shot only, accompanying her daughter who tested positive for strep throat. Due to their close contact, prescription provided for amoxicillin, in the event she develops symptoms.  1. Need for influenza vaccination - Flu Vaccine QUAD 36+ mos IM  2. Exposure to strep throat - amoxicillin (AMOXIL) 875 MG tablet; Take 1 tablet (875 mg total) by mouth 2 (two) times daily.  Dispense: 20 tablet; Refill: 0   Fernande Brashelle S. Nayeliz Hipp, PA-C Physician Assistant-Certified [facility]

## 2014-03-02 ENCOUNTER — Ambulatory Visit (INDEPENDENT_AMBULATORY_CARE_PROVIDER_SITE_OTHER): Payer: Self-pay | Admitting: Urgent Care

## 2014-03-02 VITALS — BP 122/84 | HR 84 | Temp 98.5°F | Resp 18 | Ht 66.75 in | Wt 137.0 lb

## 2014-03-02 DIAGNOSIS — J302 Other seasonal allergic rhinitis: Secondary | ICD-10-CM

## 2014-03-02 DIAGNOSIS — J029 Acute pharyngitis, unspecified: Secondary | ICD-10-CM

## 2014-03-02 DIAGNOSIS — B370 Candidal stomatitis: Secondary | ICD-10-CM

## 2014-03-02 DIAGNOSIS — K1329 Other disturbances of oral epithelium, including tongue: Secondary | ICD-10-CM

## 2014-03-02 DIAGNOSIS — J988 Other specified respiratory disorders: Secondary | ICD-10-CM

## 2014-03-02 DIAGNOSIS — R490 Dysphonia: Secondary | ICD-10-CM

## 2014-03-02 LAB — POCT RAPID STREP A (OFFICE): Rapid Strep A Screen: NEGATIVE

## 2014-03-02 LAB — POCT SKIN KOH: SKIN KOH, POC: POSITIVE

## 2014-03-02 MED ORDER — FLUCONAZOLE 100 MG PO TABS: 100.0000 mg | ORAL_TABLET | Freq: Every day | ORAL | Status: DC

## 2014-03-02 MED ORDER — FLUTICASONE PROPIONATE 50 MCG/ACT NA SUSP: 2.0000 | Freq: Every day | NASAL | Status: DC

## 2014-03-02 NOTE — Progress Notes (Addendum)
MRN: [id number] DOB: 04/29/1980  Subjective:   Tamara Guerra is a 34 y.o. female presenting for 4 day history of malaise. Patient reports significant stress with moving, work, lack of sleep. Reports that she has had longstanding history with controlling her allergies, postnasal drip, sinus pressure. Reports that she now developed 2 day history of sore throat, hoarseness, dizziness, fatigue, feels short of breath at times with increased activity at work. 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 She has had 1 sick contact with similar symptoms last week. Denies fevers, ear pain, ear drainage, chest pain, chest tightness, wheezing, n/v, abdominal pain, diarrhea. Of note, patient has been hesitant to use nasal steroid for allergies because she is worried it will increase her risk of sinus infection. Also, has had difficulty with oral thrush for 2 months s/p strep throat treatment with amoxicillin. Has been using Nystatin mouthwash and clotrimazole troches without relief. States that she was tested for HIV, which was negative in 2014. Has not been tested again since then. Has had sex with 1 new partner in the last 3 months, no vaginal symptoms currently, rashes, ROS as above. Currently smokes 1.5ppd, is not interested in quitting now but soon in the future. Currently drinks 1-2 wine glasses per day. Also reports poor dietary choices, erratic and unhealthy meals, does not drink water regularly, drinks sodas often. Denies any other aggravating or relieving factors, no other questions or concerns.  Tamara Guerra has a current medication list which includes the following prescription(s): amphetamine-dextroamphetamine, loratadine, paroxetine, and trazodone.  She is allergic to ceclor and  doxycycline.  Tamara Guerra  has a past medical history of Allergy; Substance abuse; and Anxiety. Also  has past surgical history that includes Cholecystectomy; Skin cancer excision; and Cesarean section.  ROS As in subjective.  Objective:   Vitals: BP 122/84 mmHg  Pulse 84  Temp(Src) 98.5 F (36.9 C) (Oral)  Resp 18  Ht 5' 6.75" (1.695 m)  Wt 137 lb (62.143 kg)  BMI 21.63 kg/m2  SpO2 100%  Physical Exam  Constitutional: She is oriented to person, place, and time and well-developed, well-nourished, and in no distress.  HENT:  TM's intact bilaterally, no effusions or erythema. Nasal turbinates inflamed and edematous R>L, yellowish rhinorrhea. Frothy postnasal drip present, without oropharyngeal exudates or tonsillar edema. Whitish plaque diffusely spread over tongue.  Eyes: EOM are normal. Right eye exhibits no discharge. Left eye exhibits no discharge. No scleral icterus.  Conjunctiva mildly injected bilaterally.  Neck: Normal range of motion.  Cardiovascular: Normal rate, regular rhythm, normal heart sounds and intact distal pulses.  Exam reveals no gallop and no friction rub.   No murmur heard. Pulmonary/Chest: Effort normal and breath sounds normal. No stridor. No respiratory distress. She has no wheezes. She has no rales. She exhibits no tenderness.  Lymphadenopathy:    She has no cervical adenopathy.  Neurological: She is alert and oriented to person, place, and time.  Skin: Skin is warm and dry. No rash noted. No erythema.  Psychiatric:  Tearful at times when talking about stressors at work, lack of rest.   Results for orders placed or performed in visit on 03/02/14 (from the past 24 hour(s))  POCT rapid strep A     Status: None   Collection Time: 03/02/14  5:32 PM  Result Value Ref Range   Rapid Strep A Screen Negative Negative  POCT Skin KOH     Status: None   Collection Time: 03/02/14  5:41 PM  Result Value Ref Range   Skin KOH, POC Positive    Assessment and Plan  :   1. Sore throat 2. Voice hoarseness 3. Respiratory infection - Likely undergoing viral syndrome, advised supportive care including rest, adequate sleep and hydration, light meals including soups, when respiratory illness resolves, start eating healthy regular diet (3 meals per day) - Strep culture pending - If no improvement or worsening symptoms, return to clinic in 1 week  4. Tongue plaque 5. Oral thrush - Start fluconazole for 21 days, consider HIV, STI testing given new partner in the last 3 months and current symtoms, patient stated that she would return in the future for this  6. Seasonal allergies - Start Flonase daily, continue Claritin - follow up as needed   Wallis Bamberg, PA-C [facility] [phone] 03/02/2014 5:45 PM

## 2014-03-02 NOTE — Patient Instructions (Signed)
Thrush, Adult  Thrush, also called oral candidiasis, is a fungal infection that develops in the mouth and throat and on the tongue. It causes white patches to form on the mouth and tongue. Thrush is most common in older adults, but it can occur at any age.  Many cases of thrush are mild, but this infection can also be more serious. Thrush can be a recurring problem for people who have chronic illnesses or who take medicines that limit the body's ability to fight infection. Because these people have difficulty fighting infections, the fungus that causes thrush can spread throughout the body. This can cause life-threatening blood or organ infections. CAUSES  Thrush is usually caused by a yeast called Candida albicans. This fungus is normally present in small amounts in the mouth and on other mucous membranes. It usually causes no harm. However, when conditions are present that allow the fungus to grow uncontrolled, it invades surrounding tissues and becomes an infection. Less often, other Candida species can also lead to thrush.  RISK FACTORS Thrush is more likely to develop in the following people:  People with an impaired ability to fight infection (weakened immune system).   Older adults.   People with HIV.   People with diabetes.   People with dry mouth (xerostomia).   Pregnant women.   People with poor dental care, especially those who have false teeth.   People who use antibiotic medicines.  SIGNS AND SYMPTOMS  Thrush can be a mild infection that causes no symptoms. If symptoms develop, they may include:   A burning feeling in the mouth and throat. This can occur at the start of a thrush infection.   White patches that adhere to the mouth and tongue. The tissue around the patches may be red, raw, and painful. If rubbed (during tooth brushing, for example), the patches and the tissue of the mouth may bleed easily.   A bad taste in the mouth or difficulty tasting foods.    Cottony feeling in the mouth.   Pain during eating and swallowing. DIAGNOSIS  Your health care provider can usually diagnose thrush by looking in your mouth and asking you questions about your health.  TREATMENT  Medicines that help prevent the growth of fungi (antifungals) are the standard treatment for thrush. These medicines are either applied directly to the affected area (topical) or swallowed (oral). The treatment will depend on the severity of the condition.  Mild Thrush Mild cases of thrush may clear up with the use of an antifungal mouth rinse or lozenges. Treatment usually lasts about 14 days.  Moderate to Severe Thrush  More severe thrush infections that have spread to the esophagus are treated with an oral antifungal medicine. A topical antifungal medicine may also be used.   For some severe infections, a treatment period longer than 14 days may be needed.   Oral antifungal medicines are almost never used during pregnancy because the fetus may be harmed. However, if a pregnant woman has a rare, severe thrush infection that has spread to her blood, oral antifungal medicines may be used. In this case, the risk of harm to the mother and fetus from the severe thrush infection may be greater than the risk posed by the use of antifungal medicines.  Persistent or Recurrent Thrush For cases of thrush that do not go away or keep coming back, treatment may involve the following:   Treatment may be needed twice as long as the symptoms last.   Treatment will   include both oral and topical antifungal medicines.   People with weakened immune systems can take an antifungal medicine on a continuous basis to prevent thrush infections.  It is important to treat conditions that make you more likely to get thrush, such as diabetes or HIV.  HOME CARE INSTRUCTIONS   Only take over-the-counter or prescription medicine as directed by your health care provider. Talk to your health care  provider about an over-the-counter medicine called gentian violet, which kills bacteria and fungi.   Eat plain, unflavored yogurt as directed by your health care provider. Check the label to make sure the yogurt contains live cultures. This yogurt can help healthy bacteria grow in the mouth that can stop the growth of the fungus that causes thrush.   Try these measures to help reduce the discomfort of thrush:   Drink cold liquids such as water or iced tea.   Try flavored ice treats or frozen juices.   Eat foods that are easy to swallow, such as gelatin, ice cream, or custard.   If the patches in your mouth are painful, try drinking from a straw.   Rinse your mouth several times a day with a warm saltwater rinse. You can make the saltwater mixture with 1 tsp (6 g) of salt in 8 fl oz (0.2 L) of warm water.   If you wear dentures, remove the dentures before going to bed, brush them vigorously, and soak them in a cleaning solution as directed by your health care provider.   Women who are breastfeeding should clean their nipples with an antifungal medicine as directed by their health care provider. Dry the nipples after breastfeeding. Applying lanolin-containing body lotion may help relieve nipple soreness.  SEEK MEDICAL CARE IF:  Your symptoms are getting worse or are not improving within 7 days of starting treatment.   You have symptoms of spreading infection, such as white patches on the skin outside of the mouth.   You are nursing and you have redness, burning, or pain in the nipples that is not relieved with treatment.  MAKE SURE YOU:  Understand these instructions.  Will watch your condition.  Will get help right away if you are not doing well or get worse. Document Released: 10/16/2003 Document Revised: 11/10/2012 Document Reviewed: 08/23/2012 Exit[employer] Patient Information 2015 [employer]. This information is not intended to replace advice given to you by your  health care provider. Make sure you discuss any questions you have with your health care provider.  

## 2014-03-03 ENCOUNTER — Other Ambulatory Visit: Payer: Self-pay

## 2014-03-03 DIAGNOSIS — Z209 Contact with and (suspected) exposure to unspecified communicable disease: Secondary | ICD-10-CM

## 2014-03-03 DIAGNOSIS — N898 Other specified noninflammatory disorders of vagina: Secondary | ICD-10-CM

## 2014-03-03 DIAGNOSIS — B370 Candidal stomatitis: Secondary | ICD-10-CM

## 2014-03-03 NOTE — Progress Notes (Signed)
Pt needs future labs for lab visit only. Please see Denny Peonrin when pt arrives. Thanks

## 2014-03-04 ENCOUNTER — Ambulatory Visit (INDEPENDENT_AMBULATORY_CARE_PROVIDER_SITE_OTHER): Payer: Self-pay | Admitting: Physician Assistant

## 2014-03-04 VITALS — BP 134/88 | HR 80 | Temp 98.3°F | Resp 18 | Ht 67.0 in | Wt 138.0 lb

## 2014-03-04 DIAGNOSIS — B370 Candidal stomatitis: Secondary | ICD-10-CM

## 2014-03-04 DIAGNOSIS — R5383 Other fatigue: Secondary | ICD-10-CM

## 2014-03-04 DIAGNOSIS — N898 Other specified noninflammatory disorders of vagina: Secondary | ICD-10-CM

## 2014-03-04 DIAGNOSIS — K140 Glossitis: Secondary | ICD-10-CM

## 2014-03-04 DIAGNOSIS — Z209 Contact with and (suspected) exposure to unspecified communicable disease: Secondary | ICD-10-CM

## 2014-03-04 LAB — CBC WITH DIFFERENTIAL/PLATELET
Basophils Absolute: 0 10*3/uL (ref 0.0–0.1)
Basophils Relative: 0 % (ref 0–1)
Eosinophils Absolute: 0.2 10*3/uL (ref 0.0–0.7)
Eosinophils Relative: 2 % (ref 0–5)
HCT: 43.3 % (ref 36.0–46.0)
Hemoglobin: 14.8 g/dL (ref 12.0–15.0)
LYMPHS ABS: 1.5 10*3/uL (ref 0.7–4.0)
LYMPHS PCT: 20 % (ref 12–46)
MCH: 31.5 pg (ref 26.0–34.0)
MCHC: 34.2 g/dL (ref 30.0–36.0)
MCV: 92.1 fL (ref 78.0–100.0)
MPV: 11.2 fL (ref 8.6–12.4)
Monocytes Absolute: 0.5 10*3/uL (ref 0.1–1.0)
Monocytes Relative: 7 % (ref 3–12)
NEUTROS PCT: 71 % (ref 43–77)
Neutro Abs: 5.5 10*3/uL (ref 1.7–7.7)
PLATELETS: 300 10*3/uL (ref 150–400)
RBC: 4.7 MIL/uL (ref 3.87–5.11)
RDW: 13 % (ref 11.5–15.5)
WBC: 7.7 10*3/uL (ref 4.0–10.5)

## 2014-03-04 LAB — COMPREHENSIVE METABOLIC PANEL
ALT: 16 U/L (ref 0–35)
AST: 15 U/L (ref 0–37)
Albumin: 4.4 g/dL (ref 3.5–5.2)
Alkaline Phosphatase: 44 U/L (ref 39–117)
BUN: 7 mg/dL (ref 6–23)
CO2: 27 meq/L (ref 19–32)
Calcium: 9.5 mg/dL (ref 8.4–10.5)
Chloride: 102 mEq/L (ref 96–112)
Creat: 0.89 mg/dL (ref 0.50–1.10)
GLUCOSE: 116 mg/dL — AB (ref 70–99)
Potassium: 3.8 mEq/L (ref 3.5–5.3)
SODIUM: 138 meq/L (ref 135–145)
TOTAL PROTEIN: 7.2 g/dL (ref 6.0–8.3)
Total Bilirubin: 0.3 mg/dL (ref 0.2–1.2)

## 2014-03-04 LAB — POCT WET PREP WITH KOH
Clue Cells Wet Prep HPF POC: NEGATIVE
KOH PREP POC: POSITIVE
Trichomonas, UA: NEGATIVE
YEAST WET PREP PER HPF POC: NEGATIVE

## 2014-03-04 NOTE — Progress Notes (Signed)
Subjective:    Patient ID: Tamara Guerra, female    DOB: 09/05/1980, 34 y.o.   MRN: [id number]  HPI Pt presents to clinic for recheck of fatigue and sore throat.  She has had fatigue for months but it seems to be worse recently.  She is dizzy when she changes positions and just feels bad.  Feels like she has allergy symptoms all the time - has not start the nasal spray she was given.  Her tongue is raw and has these white patches that she tries to brush off aggressively and then the pain is worse and she will stop for a while and the pain will improve. She was given Diflucan 2 days ago at a visit here but she has not started the medication because she is afraid of it.   She thinks that she had trich about 3 months ago and treated herself with Flagyl.  She is currently having vaginal itching and thinks that she had a yeast infection.  She    No illicit drugs.  Smoker 1.5-2 ppd cigarettes.  Up until 2 weeks ago 2-3 glasses of wine a night but over the last week or so not much due to fatigue.  No exercise.  Sleeps poorly - gets exhausted and then tries to catch up about 1 weekend a month by sleeping all weekend.  Eats poorly and drinks way to much soda at least a 2 liter a day.  Recently moved into another apartment and got average of 3 hours sleep a night, works erratic schedule and has a daughter to take care of.  Review of Systems  Constitutional: Negative for fever and chills.  Psychiatric/Behavioral: Positive for sleep disturbance (does not allow herself enough time to sleep).       Objective:   Physical Exam  Constitutional: She is oriented to person, place, and time. She appears well-developed and well-nourished.  BP 134/88 mmHg  Pulse 80  Temp(Src) 98.3 F (36.8 C)  Resp 18  Ht 5\' 7"  (1.702 m)  Wt 138 lb (62.596 kg)  BMI 21.61 kg/m2  SpO2 98%   HENT:  Head: Normocephalic and atraumatic.  Right Ear: Hearing, tympanic membrane, external ear and ear canal normal.  Left Ear:  Hearing normal. A foreign body (cerumen impaction) is present.  Nose: Nose normal.  Tongue bright red with areas of white that do not as plaques.  Small erythematous areas with central blister in posterior pharynx.  Eyes: Conjunctivae are normal. Pupils are equal, round, and reactive to light.  Cardiovascular: Normal rate, regular rhythm and normal heart sounds.   No murmur heard. Pulmonary/Chest: Effort normal.  Neurological: She is alert and oriented to person, place, and time.  Skin: Skin is warm and dry.  Psychiatric: She has a normal mood and affect. Her behavior is normal. Judgment and thought content normal.    Results for orders placed or performed in visit on 03/04/14  POCT Wet Prep with KOH  Result Value Ref Range   Trichomonas, UA Negative    Clue Cells Wet Prep HPF POC neg    Epithelial Wet Prep HPF POC 10-20    Yeast Wet Prep HPF POC neg    Bacteria Wet Prep HPF POC 1+    RBC Wet Prep HPF POC 0-1    WBC Wet Prep HPF POC 0-1    KOH Prep POC Positive    IV fluids given 1 L NS - pt feels better and she is not dizzy when orthostatics are  done after the IV fluids.    Assessment & Plan:  Glossitis - I think her tongue is raw from her poor health choices and she is making it raw with aggressive brushing.  Plan: Vitamin B12, check HIV due to possible thrush but really think that it is nutritional  Vaginal discharge - Plan: GC/Chlamydia Probe Amp, HIV antibody, RPR, POCT Wet Prep with KOH, Hepatitis C Ab Reflex HCV RNA, QUANT  Exposure to communicable disease - Plan: GC/Chlamydia Probe Amp, HIV antibody, RPR, Hepatitis B surface antigen, Hepatitis C Ab Reflex HCV RNA, QUANT  Other fatigue - Plan: CBC with Differential/Platelet, Comprehensive metabolic panel, TSH  Dehydration - we gave IV fluids and she felt better and she is going to continue drinking at home  We discussed at length that Diflucan is safe and she should use for 21 days to treat possible thrush.  She is to  really work hard on smoking as that is definitely not helping her health nor her allergic congestion.  She is going to try and stay hydrated and change some of her health habits like getting regular sleep and trying to improve her eating habits all of which will help her feel better and have an improved immune system.  Benny Lennert PA-C  [facility] 03/04/2014 5:43 PM

## 2014-03-05 LAB — CULTURE, GROUP A STREP

## 2014-03-05 LAB — HIV ANTIBODY (ROUTINE TESTING W REFLEX): HIV: NONREACTIVE

## 2014-03-05 LAB — SYPHILIS: RPR W/REFLEX TO RPR TITER AND TREPONEMAL ANTIBODIES, TRADITIONAL SCREENING AND DIAGNOSIS ALGORITHM

## 2014-03-05 LAB — TSH: TSH: 0.98 u[IU]/mL (ref 0.350–4.500)

## 2014-03-06 ENCOUNTER — Encounter: Payer: Self-pay | Admitting: *Deleted

## 2014-03-07 ENCOUNTER — Ambulatory Visit (INDEPENDENT_AMBULATORY_CARE_PROVIDER_SITE_OTHER): Payer: Self-pay | Admitting: Family Medicine

## 2014-03-07 ENCOUNTER — Encounter: Payer: Self-pay | Admitting: Urgent Care

## 2014-03-07 ENCOUNTER — Telehealth: Payer: Self-pay | Admitting: Emergency Medicine

## 2014-03-07 VITALS — BP 128/84 | HR 102 | Temp 98.9°F | Resp 18 | Ht 66.75 in | Wt 137.4 lb

## 2014-03-07 DIAGNOSIS — J020 Streptococcal pharyngitis: Secondary | ICD-10-CM

## 2014-03-07 DIAGNOSIS — E860 Dehydration: Secondary | ICD-10-CM

## 2014-03-07 DIAGNOSIS — H6122 Impacted cerumen, left ear: Secondary | ICD-10-CM

## 2014-03-07 LAB — POCT CBC
Granulocyte percent: 68.9 %G (ref 37–80)
HEMATOCRIT: 44.6 % (ref 37.7–47.9)
HEMOGLOBIN: 14.1 g/dL (ref 12.2–16.2)
Lymph, poc: 2 (ref 0.6–3.4)
MCH: 30.8 pg (ref 27–31.2)
MCHC: 31.7 g/dL — AB (ref 31.8–35.4)
MCV: 97 fL (ref 80–97)
MID (CBC): 0.4 (ref 0–0.9)
MPV: 8.7 fL (ref 0–99.8)
POC Granulocyte: 5.2 (ref 2–6.9)
POC LYMPH PERCENT: 26.2 %L (ref 10–50)
POC MID %: 4.9 %M (ref 0–12)
Platelet Count, POC: 235 10*3/uL (ref 142–424)
RBC: 4.59 M/uL (ref 4.04–5.48)
RDW, POC: 14.3 %
WBC: 7.6 10*3/uL (ref 4.6–10.2)

## 2014-03-07 LAB — POCT URINALYSIS DIPSTICK
Bilirubin, UA: NEGATIVE
GLUCOSE UA: NEGATIVE
Ketones, UA: NEGATIVE
Leukocytes, UA: NEGATIVE
Nitrite, UA: NEGATIVE
PROTEIN UA: NEGATIVE
SPEC GRAV UA: 1.02
Urobilinogen, UA: 0.2
pH, UA: 7

## 2014-03-07 MED ORDER — AMOXICILLIN 250 MG PO CAPS
500.0000 mg | ORAL_CAPSULE | Freq: Once | ORAL | Status: AC
  Administered 2014-03-07: 500 mg via ORAL

## 2014-03-07 MED ORDER — AMOXICILLIN 875 MG PO TABS: 875.0000 mg | ORAL_TABLET | Freq: Two times a day (BID) | ORAL | Status: DC

## 2014-03-07 NOTE — Progress Notes (Signed)
Subjective:    Patient ID: Tamara Guerra, female    DOB: 01/24/1981, 34 y.o.   MRN: [id number]  HPI  Pt presents to clinic with returning symptoms from her visit the other day.  She felt great after the 1 L IV fluids and has tried to keep her water intake high after our conversation.  She feels like she has been much better with her water intake but she has not seen a lot of different in her urine - she is not urination more often and she is not noticing her urine is clear.  She has not had much ETOH intake since her last visit and she has used no drugs other than her Rx medications.  Her throat no longer hurts and overall feels like the virus is getting better.  She has just found out that her strep culture was positive but her throat and lymph nodes feel better.  Her vague symptoms are what is bothering her and not allowing her to work due to fear of what might happen.  Last week prior to Thursday she had none of these symptoms.  Then Thursday she started getting a sore throat and myalgias and red painful spots in her mouth but those have resolved all her vague symptoms started then and they also resolved after the fluids and they have slowly returned.  The following are her vague symptoms.  Fogginess and feels like she is going in slow motion. Cannot focus on things with her vision and has trouble getting tasks done but this is different than her focus with her ADD.  She feels like her reaction time is slow.  These symptoms do not seem to be related to movement and can happen at any time.  They are worse at work but she has been resting at home.  She is stressed at work because she is afraid that these absences from work due to this illness is going to threaten her job.  She has no double vision and mild headaches that do not wake her up and spontaneously resolve without treatment.  Since her last appt she has felt thirsty and dry.  She feels more SOB that normal and she has been slightly achy.  She is  slightly off balance mainly when she is changing positions or moving quickly.  The tingling in her fingers has only slightly came back since her last visit, it is barely noticeable to the patient.    She has chronic congestion and thought she might have mold in her last apartment but she moved out recently so she is not worried about that.  She has been taking her medications as directed.  Her Trazodone is  about half the week and the other half.  She is able to take up to  but does not ever take that much medication.  She has been on this dose a long time.  She also takes Paxil  and Adderall daily.    She does not feel like these symptoms are related to anxiety though she has looked up many possible medication causes and she is worried about her jobs but she feels like she is not more anxious than normal over the last 5 days since these vague symptoms started.  Patient Active Problem List   Diagnosis Date Noted  . Anxiety and depression 05/13/2012  . Chronic insomnia 05/13/2012  . ADHD (attention deficit hyperactivity disorder) 05/13/2012   Prior to Admission medications   Medication Sig Start Date End Date  Taking? Authorizing Provider  amphetamine-dextroamphetamine (ADDERALL) 20 MG tablet Take 20 mg by mouth 3 (three) times daily.   Yes Historical Provider, MD  fluconazole (DIFLUCAN) 100 MG tablet (not started) Take 1 tablet (100 mg total) by mouth daily. 03/02/14  Yes Wallis BambergMario Mani, PA-C  fluticasone (FLONASE) 50 MCG/ACT nasal spray (not started) Place 2 sprays into both nostrils daily. 03/02/14  Yes Wallis BambergMario Mani, PA-C  loratadine (CLARITIN) 10 MG tablet Take 10 mg by mouth daily.   Yes Historical Provider, MD  PARoxetine (PAXIL) 40 MG tablet Take 1 tablet (40 mg total) by mouth daily. 05/17/13  Yes Collene GobbleSteven A Daub, MD  traZODone (DESYREL) 100 MG tablet Take 1-3 tablets (100-300 mg total) by mouth at bedtime. Patient taking differently: Take 100-200 mg by mouth at bedtime.  03/14/13   Yes Chelle Tessa LernerS Jeffery, PA-C   Allergies  Allergen Reactions  . Ceclor [Cefaclor] Anaphylaxis  . Doxycycline Other (See Comments) and Hypertension    Dizziness    Medications, allergies, past medical history, surgical history, family history, social history and problem list reviewed and updated.  Review of Systems  HENT: Positive for congestion (chronic). Negative for ear pain, mouth sores, sore throat and tinnitus.   Gastrointestinal: Positive for constipation (over the last 5-7 days - not typical for her (typically she has diarrhea)).  Neurological: Positive for light-headedness and headaches (mild). Negative for dizziness, weakness and numbness.       Objective:   Physical Exam  Constitutional: She is oriented to person, place, and time. She appears well-developed and well-nourished.  BP 128/84 mmHg  Pulse 102  Temp(Src) 98.9 F (37.2 C) (Oral)  Resp 18  Ht 5' 6.75" (1.695 m)  Wt 137 lb 6.4 oz (62.324 kg)  BMI 21.69 kg/m2  SpO2 100%   HENT:  Head: Normocephalic and atraumatic.  Right Ear: Hearing, tympanic membrane, external ear and ear canal normal.  Left Ear: Hearing, tympanic membrane and external ear normal. A foreign body (cerumen impaction - removed and then normal) is present.  Nose: Nose normal.  Mouth/Throat: Uvula is midline, oropharynx is clear and moist and mucous membranes are normal.  Eyes: Conjunctivae and EOM are normal. Pupils are equal, round, and reactive to light.  Neck: Normal range of motion. Neck supple.  Cardiovascular: Normal rate, regular rhythm and normal heart sounds.   No murmur heard. Pulmonary/Chest: Effort normal and breath sounds normal. She has no wheezes.  Musculoskeletal: Normal range of motion.  Lymphadenopathy:    She has no cervical adenopathy.  Neurological: She is alert and oriented to person, place, and time. She has normal strength and normal reflexes. No sensory deficit. She displays a negative Romberg sign. Coordination and  gait normal.  Reflex Scores:      Bicep reflexes are 2+ on the right side and 2+ on the left side.      Brachioradialis reflexes are 2+ on the right side and 2+ on the left side.      Patellar reflexes are 2+ on the right side and 2+ on the left side.      Achilles reflexes are 2+ on the right side and 2+ on the left side. Slightly slow on finger to nose testing and rapid alternating motions of hands.  Pt unsteady on [place]berg but able to catch herself without opening her eyes.  Skin: Skin is warm and dry.  Psychiatric: She has a normal mood and affect. Her behavior is normal. Judgment and thought content normal.    Results for  orders placed or performed in visit on 03/07/14  POCT CBC  Result Value Ref Range   WBC 7.6 4.6 - 10.2 K/uL   Lymph, poc 2.0 0.6 - 3.4   POC LYMPH PERCENT 26.2 10 - 50 %L   MID (cbc) 0.4 0 - 0.9   POC MID % 4.9 0 - 12 %M   POC Granulocyte 5.2 2 - 6.9   Granulocyte percent 68.9 37 - 80 %G   RBC 4.59 4.04 - 5.48 M/uL   Hemoglobin 14.1 12.2 - 16.2 g/dL   HCT, POC 40.9 81.1 - 47.9 %   MCV 97.0 80 - 97 fL   MCH, POC 30.8 27 - 31.2 pg   MCHC 31.7 (A) 31.8 - 35.4 g/dL   RDW, POC 91.4 %   Platelet Count, POC 235 142 - 424 K/uL   MPV 8.7 0 - 99.8 fL  POCT urinalysis dipstick  Result Value Ref Range   Color, UA yellow    Clarity, UA clear    Glucose, UA neg    Bilirubin, UA neg    Ketones, UA neg    Spec Grav, UA 1.020    Blood, UA trace    pH, UA 7.0    Protein, UA neg    Urobilinogen, UA 0.2    Nitrite, UA neg    Leukocytes, UA Negative        Assessment & Plan:  Dehydration - Orthostatic hypotension at beginning of appt and improved after fluids.  She feels better and most of her symptoms are resolved upon her leaving the clinic.  She will work on continued good oral hydration (she is drinking a Coke in the office).  I suspect that she is having some of these symptoms due to her trazodone and we will stop it for the next week to allow [place] to be able to  treat the strep.  Then she will slowly restart the Trazodone at  and tittrate up.  Pt is worried about not being able to sleep but we discussed that the medication may be making her have hypotension and some of these other symptoms she is experiencing.  We will send this OV over to Dr Evelene Croon so she is aware of our plan.  Plan: POCT urinalysis dipstick  Streptococcal sore throat - I am not convinced this infection is causing all her dehydration but it is definitely not helping.  We will start abx for treatment and monitor her symptoms.  Plan: POCT CBC, amoxicillin (AMOXIL) capsule 500 mg, amoxicillin (AMOXIL) capsule 500 mg, amoxicillin (AMOXIL) 875 MG tablet  Cerumen impaction, left - resolved after irrigation  Benny Lennert PA-C  [facility] 03/07/2014 5:37 PM

## 2014-03-07 NOTE — Patient Instructions (Addendum)
Because it is possible your trazodone is making your symptoms worse lets stop that for the next 3 days - this will hopefully allow you to get hydrated from the strep.  I wonder if this is decrease autonomic response due to these medications and the illness.

## 2014-03-07 NOTE — Telephone Encounter (Signed)
Phone call from patient last night. Patient states the tingling in her hands and feet is somewhat better. She continues to have a light sensation in her head associated with dizziness. She states she almost had an accident last night. She states there is something seriously wrong with her and she is very afraid of what that is. I advised her to return to clinic tomorrow morning for reevaluation. She also states she is having significant vision distortion. She denies diplopia. I told her that I would feel more comfortable if one of the other providers provided her care. I would alert them in the morning regarding the symptoms she is having. I did put her on 4 hour days at her workplace pending this evaluation. She stated she would be in the office about 11 to be checked. I will forward this to Benny LennertSarah Weber and Reita ClicheMichael Mani so they will be updated on her symptoms. I also advised her to write down all of the symptoms she is experiencing so she could give this to the provider.

## 2014-03-08 ENCOUNTER — Telehealth: Payer: Self-pay | Admitting: Emergency Medicine

## 2014-03-08 NOTE — Telephone Encounter (Signed)
Temp to 99 9. Symptoms are headache, muscle and body aches, especially in the legs. Dizziness is better. advised to take Advil or Tylenol and stay well hydrated during the day

## 2014-03-09 ENCOUNTER — Encounter: Payer: Self-pay | Admitting: Physician Assistant

## 2014-03-09 ENCOUNTER — Other Ambulatory Visit: Payer: Self-pay | Admitting: Physician Assistant

## 2014-03-09 ENCOUNTER — Telehealth: Payer: Self-pay | Admitting: Physician Assistant

## 2014-03-09 DIAGNOSIS — R51 Headache: Secondary | ICD-10-CM

## 2014-03-09 DIAGNOSIS — R519 Headache, unspecified: Secondary | ICD-10-CM

## 2014-03-09 DIAGNOSIS — E860 Dehydration: Secondary | ICD-10-CM

## 2014-03-09 DIAGNOSIS — R2990 Unspecified symptoms and signs involving the nervous system: Secondary | ICD-10-CM

## 2014-03-09 DIAGNOSIS — R279 Unspecified lack of coordination: Secondary | ICD-10-CM

## 2014-03-09 LAB — GC/CHLAMYDIA PROBE AMP
CT Probe RNA: NEGATIVE
GC Probe RNA: NEGATIVE
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
## 2014-03-10 ENCOUNTER — Encounter: Payer: Self-pay | Admitting: Physician Assistant

## 2014-03-10 ENCOUNTER — Ambulatory Visit
Admission: RE | Admit: 2014-03-10 | Discharge: 2014-03-10 | Disposition: A | Discharge status: Home / Self Care | Payer: No Typology Code available for payment source | Source: Ambulatory Visit | Attending: Physician Assistant | Admitting: Physician Assistant

## 2014-03-10 DIAGNOSIS — R51 Headache: Principal | ICD-10-CM

## 2014-03-10 DIAGNOSIS — R2990 Unspecified symptoms and signs involving the nervous system: Secondary | ICD-10-CM

## 2014-03-10 DIAGNOSIS — R519 Headache, unspecified: Secondary | ICD-10-CM

## 2014-03-10 DIAGNOSIS — R279 Unspecified lack of coordination: Secondary | ICD-10-CM

## 2014-03-10 LAB — VITAMIN B12: Vitamin B-12: 320 pg/mL (ref 211–911)

## 2014-03-10 LAB — HEPATITIS B SURFACE ANTIGEN: Hepatitis B Surface Ag: NEGATIVE

## 2014-03-10 LAB — HEPATITIS C ANTIBODY: HCV AB: NEGATIVE

## 2014-03-13 ENCOUNTER — Emergency Department (HOSPITAL_COMMUNITY): Payer: Self-pay

## 2014-03-13 ENCOUNTER — Ambulatory Visit (INDEPENDENT_AMBULATORY_CARE_PROVIDER_SITE_OTHER): Payer: Self-pay | Admitting: Family Medicine

## 2014-03-13 ENCOUNTER — Telehealth: Payer: Self-pay

## 2014-03-13 ENCOUNTER — Emergency Department (HOSPITAL_COMMUNITY)
Admission: EM | Admit: 2014-03-13 | Discharge: 2014-03-14 | Disposition: A | Discharge status: Home / Self Care | Payer: Self-pay | Attending: Emergency Medicine | Admitting: Emergency Medicine

## 2014-03-13 ENCOUNTER — Encounter (HOSPITAL_COMMUNITY): Payer: Self-pay | Admitting: Emergency Medicine

## 2014-03-13 VITALS — BP 130/88 | HR 86 | Temp 98.3°F | Resp 16 | Ht 66.75 in | Wt 137.0 lb

## 2014-03-13 DIAGNOSIS — R519 Headache, unspecified: Secondary | ICD-10-CM

## 2014-03-13 DIAGNOSIS — Z7951 Long term (current) use of inhaled steroids: Secondary | ICD-10-CM | POA: Insufficient documentation

## 2014-03-13 DIAGNOSIS — R202 Paresthesia of skin: Secondary | ICD-10-CM

## 2014-03-13 DIAGNOSIS — Z79899 Other long term (current) drug therapy: Secondary | ICD-10-CM | POA: Insufficient documentation

## 2014-03-13 DIAGNOSIS — G44009 Cluster headache syndrome, unspecified, not intractable: Secondary | ICD-10-CM

## 2014-03-13 DIAGNOSIS — R29818 Other symptoms and signs involving the nervous system: Secondary | ICD-10-CM

## 2014-03-13 DIAGNOSIS — Z792 Long term (current) use of antibiotics: Secondary | ICD-10-CM | POA: Insufficient documentation

## 2014-03-13 DIAGNOSIS — Z8709 Personal history of other diseases of the respiratory system: Secondary | ICD-10-CM | POA: Insufficient documentation

## 2014-03-13 DIAGNOSIS — R29898 Other symptoms and signs involving the musculoskeletal system: Secondary | ICD-10-CM

## 2014-03-13 DIAGNOSIS — R531 Weakness: Secondary | ICD-10-CM

## 2014-03-13 DIAGNOSIS — Z720 Tobacco use: Secondary | ICD-10-CM | POA: Insufficient documentation

## 2014-03-13 DIAGNOSIS — R413 Other amnesia: Secondary | ICD-10-CM

## 2014-03-13 DIAGNOSIS — R197 Diarrhea, unspecified: Secondary | ICD-10-CM | POA: Insufficient documentation

## 2014-03-13 DIAGNOSIS — R51 Headache: Secondary | ICD-10-CM

## 2014-03-13 DIAGNOSIS — F419 Anxiety disorder, unspecified: Secondary | ICD-10-CM | POA: Insufficient documentation

## 2014-03-13 LAB — I-STAT BETA HCG BLOOD, ED (MC, WL, AP ONLY): I-stat hCG, quantitative: <5 m[IU]/mL (ref <5)

## 2014-03-13 LAB — CBC WITH DIFFERENTIAL/PLATELET
BASOS PCT: 0 % (ref 0–1)
Basophils Absolute: 0 10*3/uL (ref 0.0–0.1)
EOS ABS: 0.2 10*3/uL (ref 0.0–0.7)
Eosinophils Relative: 3 % (ref 0–5)
HEMATOCRIT: 38.2 % (ref 36.0–46.0)
Hemoglobin: 13.5 g/dL (ref 12.0–15.0)
LYMPHS PCT: 30 % (ref 12–46)
Lymphs Abs: 2.2 10*3/uL (ref 0.7–4.0)
MCH: 31.3 pg (ref 26.0–34.0)
MCHC: 35.3 g/dL (ref 30.0–36.0)
MCV: 88.6 fL (ref 78.0–100.0)
MONO ABS: 0.7 10*3/uL (ref 0.1–1.0)
MONOS PCT: 9 % (ref 3–12)
NEUTROS PCT: 58 % (ref 43–77)
Neutro Abs: 4.3 10*3/uL (ref 1.7–7.7)
PLATELETS: 227 10*3/uL (ref 150–400)
RBC: 4.31 MIL/uL (ref 3.87–5.11)
RDW: 12.2 % (ref 11.5–15.5)
WBC: 7.4 10*3/uL (ref 4.0–10.5)

## 2014-03-13 LAB — BASIC METABOLIC PANEL
Anion gap: 6 (ref 5–15)
BUN: <5 mg/dL — ABNORMAL LOW (ref 6–23)
CO2: 27 mmol/L (ref 19–32)
CREATININE: 0.68 mg/dL (ref 0.50–1.10)
Calcium: 8.9 mg/dL (ref 8.4–10.5)
Chloride: 104 mmol/L (ref 96–112)
GFR calc Af Amer: >90 mL/min (ref >90)
GFR calc non Af Amer: >90 mL/min (ref >90)
Glucose, Bld: 69 mg/dL — ABNORMAL LOW (ref 70–99)
Potassium: 3.5 mmol/L (ref 3.5–5.1)
Sodium: 137 mmol/L (ref 135–145)

## 2014-03-13 LAB — POCT SEDIMENTATION RATE: POCT SED RATE: 10 mm/h (ref 0–22)

## 2014-03-13 MED ORDER — LIDOCAINE-PRILOCAINE 2.5-2.5 % EX CREA
TOPICAL_CREAM | Freq: Once | CUTANEOUS | Status: AC
  Administered 2014-03-14: via TOPICAL
  Filled 2014-03-13 (×2): qty 5

## 2014-03-13 MED ORDER — IBUPROFEN 400 MG PO TABS
600.0000 mg | ORAL_TABLET | Freq: Once | ORAL | Status: AC
  Administered 2014-03-13: 600 mg via ORAL
  Filled 2014-03-13 (×2): qty 1

## 2014-03-13 MED ORDER — NICOTINE 21 MG/24HR TD PT24: 21.0000 mg | MEDICATED_PATCH | Freq: Once | TRANSDERMAL | Status: DC

## 2014-03-13 MED ORDER — NICOTINE 21 MG/24HR TD PT24
21.0000 mg | MEDICATED_PATCH | Freq: Once | TRANSDERMAL | Status: DC
Start: 2014-03-13 — End: 2014-03-14
  Administered 2014-03-13: 21 mg via TRANSDERMAL
  Filled 2014-03-13: qty 1

## 2014-03-13 MED ORDER — GADOBENATE DIMEGLUMINE 529 MG/ML IV SOLN
12.0000 mL | Freq: Once | INTRAVENOUS | Status: AC | PRN
  Administered 2014-03-13: 12 mL via INTRAVENOUS

## 2014-03-13 NOTE — ED Notes (Signed)
Pt placed on monitor. Pt remains monitored by blood pressure and pulse ox. Pts family remains at bedside.  

## 2014-03-13 NOTE — ED Notes (Signed)
Pt c/o viral sx x 2 weeks and now having HA x 4 days and some confusion per pt; pt alert at present; pt sts some sore throat and appears to have hoarse voice

## 2014-03-13 NOTE — ED Notes (Signed)
Spoke with pharmacy stated tubed medication to POD E.

## 2014-03-13 NOTE — ED Provider Notes (Signed)
CSN: [id number]     Arrival date & time 03/13/14  1346 History   First MD Initiated Contact with Patient 03/13/14 1652     Chief Complaint  Patient presents with  . Headache     (Consider location/radiation/quality/duration/timing/severity/associated sxs/prior Treatment) The history is provided by the patient and medical records.    Tamara Guerra is a 34 year old female with a past medical history of anxiety and substance abuse presents with a 4 day history of headache and 10 day history of URI type symptoms. Pain is 4/10, present at night and upon waking, location is constantly changing, and is relieved by OTC ibuprofen. Patient states that symptoms began with sore throat and subjective fever about ten days ago and has progressed to headache and neurological complaints. Patient states she feels she is not able to focus like normal, short term memory difficulty, confusion, slowed/ delayed cognition (unable to react to other cars in traffic), difficulty with fine motor movement, and generalized weakness. Reports some neck pain a few days ago however this has since resolved. States that symptoms are interfering with her ability to work and function in daily life. Has been seen at urgent care where she tested negative for strep, was given a liter of fluid with temporary relief of symptoms, and started on amoxicillin. Had CT of head several days ago as well as various blood work all of which have been normal thus far. Denies dizziness, rhinorrhea, cough, shortness of breath, chest pain, nausea, vomiting, constipation, dysuria, and urinary frequency.  Was seen in triage by neurology. Reports several days of diarrhea which correspond with initiation of antibiotic therapy. States they will refuse any medication that is habit forming due to her history of substance abuse. Vital signs stable on arrival.    Past Medical History  Diagnosis Date  . Allergy   . Substance abuse   . Anxiety    Past Surgical  History  Procedure Laterality Date  . Cholecystectomy    . Skin cancer excision      stage 2 malignant melanoma  . Cesarean section     Family History  Problem Relation Age of Onset  . Sarcoidosis Mother     neurosarcoidosis  . Hypertension Father   . Hyperlipidemia Father   . Cancer Sister     Melanoma   History  Substance Use Topics  . Smoking status: Current Every Day Smoker -- 1.00 packs/day    Types: Cigarettes  . Smokeless tobacco: Never Used     Comment: trying nicotine replacement gum and e-cig to try to cut back  . Alcohol Use: Not on file   OB History    No data available     Review of Systems  HENT: Positive for voice change (hoarse).   Eyes: Negative for photophobia.  Gastrointestinal: Positive for diarrhea (due to abx). Negative for nausea and vomiting.  Neurological: Positive for weakness and headaches.  Psychiatric/Behavioral: Positive for confusion.  All other systems reviewed and are negative.     Allergies  Ceclor and Doxycycline  Home Medications   Prior to Admission medications   Medication Sig Start Date End Date Taking? Authorizing Provider  amoxicillin (AMOXIL) 875 MG tablet Take 1 tablet (875 mg total) by mouth 2 (two) times daily. 03/07/14   Morrell RiddleSarah L Weber, PA-C  amphetamine-dextroamphetamine (ADDERALL) 20 MG tablet Take 20 mg by mouth 3 (three) times daily.    Historical Provider, MD  fluconazole (DIFLUCAN) 100 MG tablet Take 1 tablet (100 mg total)  by mouth daily. 03/02/14   Wallis Bamberg, PA-C  fluticasone [facility]) 50 MCG/ACT nasal spray Place 2 sprays into both nostrils daily. 03/02/14   Wallis Bamberg, PA-C  loratadine (CLARITIN) 10 MG tablet Take 10 mg by mouth daily.    Historical Provider, MD  PARoxetine (PAXIL) 40 MG tablet Take 1 tablet (40 mg total) by mouth daily. 05/17/13   Collene Gobble, MD  traZODone (DESYREL) 100 MG tablet Take 1-3 tablets (100-300 mg total) by mouth at bedtime. Patient taking differently: Take 100-200 mg by mouth at  bedtime.  03/14/13   Chelle S Jeffery, PA-C   BP 132/91 mmHg  Pulse 75  Temp(Src) 98.2 F (36.8 C)  Resp 18  SpO2 100%   Physical Exam  Constitutional: She is oriented to person, place, and time. She appears well-developed and well-nourished.  Non-toxic appearance.  HENT:  Head: Normocephalic and atraumatic.  Right Ear: Tympanic membrane normal.  Left Ear: Tympanic membrane normal.  Mouth/Throat: Oropharynx is clear and moist.  Eyes: Conjunctivae and EOM are normal. Pupils are equal, round, and reactive to light.  Neck: Normal range of motion and full passive range of motion without pain. Neck supple. No spinous process tenderness and no muscular tenderness present. No rigidity. Normal range of motion present.  No meningismus  Cardiovascular: Normal rate, regular rhythm and normal heart sounds.   Pulmonary/Chest: Effort normal and breath sounds normal.  Abdominal: Soft. Bowel sounds are normal. There is no tenderness.  Musculoskeletal: Normal range of motion.  Neurological: She is alert and oriented to person, place, and time.  AAOx3, answering questions appropriately; equal strength UE and LE bilaterally 5/5; CN grossly intact; moves all extremities appropriately without ataxia; no focal neuro deficits or facial asymmetry appreciated  Skin: Skin is warm and dry.  Psychiatric: She has a normal mood and affect.  Nursing note and vitals reviewed.   ED Course  Procedures (including critical care time) Labs Review Labs Reviewed  BASIC METABOLIC PANEL - Abnormal; Notable for the following:    Glucose, Bld 69 (*)    BUN <5 (*)    All other components within normal limits  CSF CULTURE  GRAM STAIN  CBC WITH DIFFERENTIAL/PLATELET  METHYLMALONIC ACID(MMA), RND URINE  ANA  C-REACTIVE PROTEIN  ANGIOTENSIN CONVERTING ENZYME  CSF CELL COUNT WITH DIFFERENTIAL  CSF CELL COUNT WITH DIFFERENTIAL  GLUCOSE, CSF  PROTEIN, CSF  ANGIOTENSIN CONVERTING ENZYME, CSF  I-STAT BETA HCG BLOOD, ED  ([facility], [facility], [facility] ONLY)    Imaging Review Mr Laqueta Jean Wo Contrast  03/13/2014   CLINICAL DATA:  Initial evaluation flow are cluster headache.  EXAM: MRI HEAD WITHOUT AND WITH CONTRAST  TECHNIQUE: Multiplanar, multiecho pulse sequences of the brain and surrounding structures were obtained without and with intravenous contrast.  CONTRAST:  12mL MULTIHANCE GADOBENATE DIMEGLUMINE 529 MG/ML IV SOLN  COMPARISON:  Prior CT from 03/10/2014.  FINDINGS: The CSF containing spaces are within normal limits for patient age. No focal parenchymal signal abnormality is identified. No mass lesion, midline shift, or extra-axial fluid collection. Ventricles are normal in size without evidence of hydrocephalus.  No diffusion-weighted signal abnormality is identified to suggest acute intracranial infarct. Gray-white matter differentiation is maintained. Normal flow voids are seen within the intracranial vasculature. No intracranial hemorrhage identified.  The cervicomedullary junction is normal. Pituitary gland is within normal limits. Pituitary stalk is midline. The globes and optic nerves demonstrate a normal appearance with normal signal intensity. No abnormal enhancement on post-contrast sequences.  The bone marrow  signal intensity is normal. Calvarium is intact. Visualized upper cervical spine is within normal limits.  Scalp soft tissues are unremarkable.  Paranasal sinuses are clear. Scattered fluid density noted within the inferior right mastoid air cells. Left mastoid air cells are clear.  IMPRESSION: Normal contrast enhanced MRI of the brain.   Electronically Signed   By: Rise Mu M.D.   On: 03/13/2014 21:14     EKG Interpretation None      MDM   Final diagnoses:  Headache, unspecified headache type   34 year old female with headaches and multiple neurologic symptoms. She has been evaluated in urgent care with normal blood work and negative head CT thus far.  On exam, patient is afebrile and nontoxic in  appearance. She has no new meningeal signs on exam. Her neurologic exam is nonfocal.  She has already been evaluated by neurology and recommendations were provided including MRI brain with and without contrast, lumbar puncture, ANA, CRP, ACE, MMA which have been ordered.    MRI negative for acute findings.  LP performed by Dr. Madilyn Hook, clear fluid returned.  CSF results pending.  Care signed out to PA Upstill at shift change.  If results without acute abnormalities, patient to be d/c home and FU with OP neurology (Dr. Anne Hahn) on 03/17/14 as scheduled for her already.  Garlon Hatchet, PA-C 03/14/14 0105  Garlon Hatchet, PA-C 03/14/14 8119  Tilden Fossa, MD 03/14/14 [phone]

## 2014-03-13 NOTE — ED Notes (Signed)
Medication tubed to POD E given to EDP to administer.  Updated patient and family members at bedside.

## 2014-03-13 NOTE — ED Notes (Signed)
Patient explained to the Doctor and nurse smokes 1 1/2 - 2 packs of cigarettes a day. Asked if she can use her own nicotine gum however cannot find any.  Requested nicotine patch.

## 2014-03-13 NOTE — Telephone Encounter (Signed)
Called patient and left her two messages to call me back trying to get her in this week to see Dr. Terrace ArabiaYan. If patient calls back please put her in first available slot.

## 2014-03-13 NOTE — ED Notes (Signed)
EDP at bedside  
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
## 2014-03-13 NOTE — Consult Note (Signed)
NEURO HOSPITALIST CONSULT NOTE    Reason for Consult: Difficulty with memory, joint pain, finger numbess  HPI:                                                                                                                                          Tamara Guerra is an 34 y.o. female with history of substance abuse (no in remission), Heavy ETOH use in past, recent flue like symptoms now presenting with a two week history of multitude of symptoms.  She states over the past two weeks she has noticed " joint pains, decreased sensation and tingling in the ring and index finger of both hands, sensation of slow thought processing and reaction time, HA (tension in sensation), imbalance, decreased depth perception when dark out side, pain in the left occipital nerve region.  She states this has been ongoing for two weeks and now concerned.  Her mother has neurosarcoidoses. Patient is concerned it may be neuro related and came to ED to be evaluated.     Past Medical History  Diagnosis Date  . Allergy   . Substance abuse   . Anxiety     Past Surgical History  Procedure Laterality Date  . Cholecystectomy    . Skin cancer excision      stage 2 malignant melanoma  . Cesarean section      Family History  Problem Relation Age of Onset  . Sarcoidosis Mother     neurosarcoidosis  . Hypertension Father   . Hyperlipidemia Father   . Cancer Sister     Melanoma     Social History:  reports that she has been smoking Cigarettes.  She has been smoking about 1.00 pack per day. She has never used smokeless tobacco. Her alcohol and drug histories are not on file.  Allergies  Allergen Reactions  . Ceclor [Cefaclor] Anaphylaxis  . Doxycycline Other (See Comments) and Hypertension    Dizziness    MEDICATIONS:                                                                                                                     No current facility-administered medications for this  encounter.   Current Outpatient Prescriptions  Medication Sig Dispense Refill  . amoxicillin (AMOXIL) 875 MG  tablet Take 1 tablet (875 mg total) by mouth 2 (two) times daily. 20 tablet 0  . amphetamine-dextroamphetamine (ADDERALL) 20 MG tablet Take 20 mg by mouth 3 (three) times daily.    . fluconazole (DIFLUCAN) 100 MG tablet Take 1 tablet (100 mg total) by mouth daily. 23 tablet 0  . fluticasone (FLONASE) 50 MCG/ACT nasal spray Place 2 sprays into both nostrils daily. 16 g 12  . loratadine (CLARITIN) 10 MG tablet Take 10 mg by mouth daily.    Marland Kitchen PARoxetine (PAXIL) 40 MG tablet Take 1 tablet (40 mg total) by mouth daily. 30 tablet 2  . traZODone (DESYREL) 100 MG tablet Take 1-3 tablets (100-300 mg total) by mouth at bedtime. (Patient taking differently: Take 100-200 mg by mouth at bedtime. ) 10 tablet 0      ROS:                                                                                                                                       History obtained from the patient  General ROS: negative for - chills, fatigue, fever, night sweats, weight gain or weight loss Psychological ROS: negative for - behavioral disorder, hallucinations, memory difficulties, mood swings or suicidal ideation Ophthalmic ROS: negative for - blurry vision, double vision, eye pain or loss of vision ENT ROS: negative for - epistaxis, nasal discharge, oral lesions, sore throat, tinnitus or vertigo Allergy and Immunology ROS: negative for - hives or itchy/watery eyes Hematological and Lymphatic ROS: negative for - bleeding problems, bruising or swollen lymph nodes Endocrine ROS: negative for - galactorrhea, hair pattern changes, polydipsia/polyuria or temperature intolerance Respiratory ROS: negative for - cough, hemoptysis, shortness of breath or wheezing Cardiovascular ROS: negative for - chest pain, dyspnea on exertion, edema or irregular heartbeat Gastrointestinal ROS: negative for - abdominal pain,  diarrhea, hematemesis, nausea/vomiting or stool incontinence Genito-Urinary ROS: negative for - dysuria, hematuria, incontinence or urinary frequency/urgency Musculoskeletal ROS: negative for - joint swelling or muscular weakness Neurological ROS: as noted in HPI Dermatological ROS: negative for rash and skin lesion changes   Blood pressure 141/92, pulse 77, temperature 98.2 F (36.8 C), resp. rate 18, SpO2 100 %.   Neurologic Examination:                                                                                                      HEENT-  Normocephalic, no lesions, without obvious abnormality.  Normal external eye and conjunctiva.  Normal TM's bilaterally.  Normal auditory  canals and external ears. Normal external nose, mucus membranes and septum.  Normal pharynx. Cardiovascular- S1, S2 normal, pulses palpable throughout   Lungs- chest clear, no wheezing, rales, normal symmetric air entry Abdomen- normal findings: bowel sounds normal Extremities- less then 2 second capillary refill Lymph-no adenopathy palpable Musculoskeletal-no joint tenderness, deformity or swelling Skin-warm and dry, no hyperpigmentation, vitiligo, or suspicious lesions  Neurological Examination Mental Status: Alert, oriented, thought content appropriate.  Speech fluent without evidence of aphasia.  Able to follow 3 step commands without difficulty. Cranial Nerves: II: Discs flat bilaterally; Visual fields grossly normal, pupils equal, round, reactive to light and accommodation III,IV, VI: ptosis not present, extra-ocular motions intact bilaterally V,VII: smile symmetric, facial light touch sensation normal bilaterally VIII: hearing normal bilaterally IX,X: gag reflex present XI: bilateral shoulder shrug XII: midline tongue extension Motor: Right : Upper extremity   5/5    Left:     Upper extremity   5/5  Lower extremity   5/5     Lower extremity   5/5 Tone and bulk:normal tone throughout; no atrophy  noted Sensory: Pinprick and light touch intact throughout, bilaterally Deep Tendon Reflexes: 2+ and symmetric throughout Plantars: Right: downgoing   Left: downgoing Cerebellar: normal finger-to-nose, normal rapid alternating movements and normal heel-to-shin test Gait: normal gait and station      Lab Results: Basic Metabolic Panel: No results for input(s): NA, K, CL, CO2, GLUCOSE, BUN, CREATININE, CALCIUM, MG, PHOS in the last 168 hours.  Liver Function Tests: No results for input(s): AST, ALT, ALKPHOS, BILITOT, PROT, ALBUMIN in the last 168 hours. No results for input(s): LIPASE, AMYLASE in the last 168 hours. No results for input(s): AMMONIA in the last 168 hours.  CBC:  Recent Labs Lab 03/07/14 1224  WBC 7.6  HGB 14.1  HCT 44.6  MCV 97.0    Cardiac Enzymes: No results for input(s): CKTOTAL, CKMB, CKMBINDEX, TROPONINI in the last 168 hours.  Lipid Panel: No results for input(s): CHOL, TRIG, HDL, CHOLHDL, VLDL, LDLCALC in the last 168 hours.  CBG: No results for input(s): GLUCAP in the last 168 hours.  Microbiology: Results for orders placed or performed in visit on 03/04/14  GC/Chlamydia Probe Amp     Status: None   Collection Time: 03/04/14  1:34 PM  Result Value Ref Range Status   CT Probe RNA NEGATIVE  Final    Comment: SAMPLE NOT PRESERVED WITHIN 24 HOURS OF COLLECTION, CLINICAL CORRELATION SUGGESTED.    GC Probe RNA NEGATIVE  Final    Comment:                                                                                         **Normal Reference Range: Negative**         Assay performed using the Gen-Probe APTIMA COMBO2 (R) Assay.   Acceptable specimen types for this assay include APTIMA Swabs (Unisex, endocervical, urethral, or vaginal), first void urine, and ThinPrep liquid based cytology samples.     Coagulation Studies: No results for input(s): LABPROT, INR in the last 72 hours.  Imaging: No results found.     Assessment and plan  per attending  neurologist  Felicie MornDavid Smith PA-C [facility] [phone]  03/13/2014, 3:22 PM   Assessment/Plan: 34 YO with multitude of different symptoms as described above.  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 Etiology for her recent memory difficulty, bilateral hand numbness, delayed reaction is unclear at this time. Less likely to be infectious but cannot rule this out. Based on age and varied symptoms would also consider an autoimmune process. Of note, patients mom has history of neurosarcoidosis.     Recommend: 1) LP in ED 2) MRI brain with and without contrast.  3) Lab test including MMA, ACE, ANA, CRP, .  LP labs including gram stain, cell count protein, glucose, CSF ACE level 4) If MRI and LP unremarkable no need for inpatient admission. Lab work can be followed up with outpatient neurology 5) follow up with out patient neurology --already made appointment for 0830 AM this Friday 03/17/2014  Patient seen and evaluated. Agree with above assessment and plan. In brief, she is a pleasant young woman presenting with multiple concerns. Infectious process less likely but this cannot be ruled out. Based on age and symptoms would have concern for possible autoimmune process. If MRI and LP unremarkable can follow up with outpatient neurology for lab results. Plan was discussed with patient and her father who is a primary care physician with [employer]. They expressed understanding.   Elspeth Choeter Zissel Biederman, DO [facility] [phone]  If 7pm- 7am, please page neurology on call as listed in [facility].

## 2014-03-13 NOTE — ED Notes (Signed)
Patient father at bedside. 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  Had a CT of the head 3 days ago negative per father and patient.  Sent to ED to see Neurologist states seen Neurologist while in triage. Currently alert answering and following commands appropriate.

## 2014-03-14 ENCOUNTER — Telehealth: Payer: Self-pay | Admitting: Family Medicine

## 2014-03-14 LAB — CSF CELL COUNT WITH DIFFERENTIAL
RBC Count, CSF: 0 /mm3
RBC Count, CSF: 0 /mm3
Tube #: 1
Tube #: 4
WBC CSF: 0 /mm3 (ref 0–5)
WBC, CSF: 1 /mm3 (ref 0–5)

## 2014-03-14 LAB — PROTEIN, CSF: Total  Protein, CSF: 32 mg/dL (ref 15–45)

## 2014-03-14 LAB — GRAM STAIN

## 2014-03-14 LAB — B. BURGDORFI ANTIBODIES: B burgdorferi Ab IgG+IgM: 0.75 {ISR}

## 2014-03-14 LAB — C-REACTIVE PROTEIN

## 2014-03-14 LAB — ANA: ANA: NEGATIVE

## 2014-03-14 LAB — ANGIOTENSIN CONVERTING ENZYME: Angiotensin-Converting Enzyme: 39 U/L (ref 8–52)

## 2014-03-14 LAB — GLUCOSE, CSF: Glucose, CSF: 56 mg/dL (ref 43–76)

## 2014-03-14 NOTE — ED Notes (Signed)
Discharge and follow up instructions reviewed with pt. Pt verbalized understanding.  

## 2014-03-14 NOTE — Telephone Encounter (Signed)
LM that Lyme IgM negative

## 2014-03-14 NOTE — ED Provider Notes (Signed)
LUMBAR PUNCTURE Date/Time: 03/14/2014 12:17 AM Performed by: Tilden FossaEES, Dodger Sinning Authorized by: Tilden FossaEES, Manami Tutor Consent: Verbal consent obtained. Written consent obtained. Risks and benefits: risks, benefits and alternatives were discussed Consent given by: patient Patient understanding: patient states understanding of the procedure being performed Patient consent: the patient's understanding of the procedure matches consent given Relevant documents: relevant documents present and verified Imaging studies: imaging studies available Patient identity confirmed: verbally with patient Indications: evaluation for infection Anesthesia: local infiltration Local anesthetic: lidocaine 1% without epinephrine (EMLA cream) Anesthetic total: 3 ml Patient sedated: no Preparation: Patient was prepped and draped in the usual sterile fashion. Lumbar space: L3-L4 interspace Patient's position: left lateral decubitus Needle gauge: 20 Number of attempts: 1 Fluid appearance: clear Tubes of fluid: 4 Total volume: 5 ml Post-procedure: site cleaned and adhesive bandage applied Patient tolerance: Patient tolerated the procedure well with no immediate complications     Tilden FossaElizabeth Rafael Salway, MD 03/14/14 [phone]

## 2014-03-14 NOTE — Discharge Instructions (Signed)
May continue motrin as needed for headache. Follow-up with Dr. Anne HahnWillis as scheduled for you on 03/17/14. Return to the ED for new concerns.

## 2014-03-14 NOTE — ED Provider Notes (Signed)
Patient care assumed from Sharilyn SitesLisa Sanders, PA-C, and end of shift.  LP results show neg Gram Stain and Cell Count 1 and 4 in respective tubes #1 and #4 (normal). She is feeling improved, without headache, VSS. She has neurology follow up in place and is felt stable for discharge.   Arnoldo HookerShari A Kaelob Persky, PA-C 03/14/14 0201  Tilden FossaElizabeth Rees, MD 03/14/14 [phone]

## 2014-03-14 NOTE — ED Notes (Signed)
Pt stated that her headache is completely gone at this time.

## 2014-03-15 ENCOUNTER — Encounter: Payer: Self-pay | Admitting: Physician Assistant

## 2014-03-15 LAB — ANGIOTENSIN CONVERTING ENZYME, CSF: Angio Convert Enzyme: 8 U/L (ref <=15)

## 2014-03-17 ENCOUNTER — Encounter: Payer: Self-pay | Admitting: Neurology

## 2014-03-17 ENCOUNTER — Ambulatory Visit (INDEPENDENT_AMBULATORY_CARE_PROVIDER_SITE_OTHER): Payer: Self-pay | Admitting: Neurology

## 2014-03-17 VITALS — BP 120/77 | HR 92 | Ht 67.0 in | Wt 138.0 lb

## 2014-03-17 DIAGNOSIS — B349 Viral infection, unspecified: Secondary | ICD-10-CM | POA: Insufficient documentation

## 2014-03-17 LAB — CSF CULTURE W GRAM STAIN
Culture: NO GROWTH
Gram Stain: NONE SEEN

## 2014-03-17 LAB — CSF CULTURE

## 2014-03-17 NOTE — Progress Notes (Signed)
Reason for visit: Ataxia, headache  Tamara Guerra is a 34 y.o. female  History of present illness:  Tamara Guerra is a 34 year old white female with a prior history of substance abuse. The patient indicates that within the last 2 or 3 weeks, she developed a viral syndrome with a sore throat, swollen lymph nodes, and low-grade fevers. The patient has had a significant increase in fatigue. The patient indicates that she had severe fatigue to the point she became exhausted, short of breath with minimal activity. She had some cognitive slowing, ataxia, difficulty focusing with vision. She had some tingling sensations into the hands and arms. She went to the emergency room, and MRI evaluation of the brain was normal. Lumbar puncture was done, this was completely normal. Extensive blood work testing was undertaken, and no abnormalities were found. No evidence of lupus, HIV infection, sarcoidosis, or hepatitis was found. The patient indicates that she was not treating herself well, and she has been getting minimal sleep, not eating properly. The patient has begun to get rest, staying out of work over the last 3 days, and she has started to feel much better. She is no longer having any fevers, and energy levels have come back, the ataxia has improved. She comes to this office for an evaluation. 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  Past Medical History  Diagnosis Date  . Allergy   . Substance abuse   . Anxiety     Past Surgical History  Procedure Laterality Date  . Cholecystectomy    . Skin cancer excision      stage 2 malignant melanoma  . Cesarean section      Family History  Problem Relation Age of Onset  . Sarcoidosis Mother     neurosarcoidosis  . Hypertension Father   . Hyperlipidemia Father   . Cancer Sister     Melanoma    Social history:  reports that she has been smoking Cigarettes.  She has been smoking about 1.00 pack per day.  She has never used smokeless tobacco. Her alcohol and drug histories are not on file.  Medications:  Current Outpatient Prescriptions on File Prior to Visit  Medication Sig Dispense Refill  . amphetamine-dextroamphetamine (ADDERALL) 20 MG tablet Take 10-20 mg by mouth 2 (two) times daily. Takes 1 tab in am and 0.5 tab in afternoon    . fluticasone (FLONASE) 50 MCG/ACT nasal spray Place 2 sprays into both nostrils daily. (Patient taking differently: Place 2 sprays into both nostrils as needed. ) 16 g 12  . ibuprofen (ADVIL,MOTRIN) 200 MG tablet Take 400 mg by mouth every 6 (six) hours as needed for moderate pain.    Marland Kitchen loratadine (CLARITIN) 10 MG tablet Take 10 mg by mouth daily.    . Multiple Vitamin (MULTIVITAMIN WITH MINERALS) TABS tablet Take 1 tablet by mouth daily.    Marland Kitchen PARoxetine (PAXIL) 40 MG tablet Take 1 tablet (40 mg total) by mouth daily. (Patient taking differently: Take 40 mg by mouth at bedtime. ) 30 tablet 2  . traZODone (DESYREL) 100 MG tablet Take 1-3 tablets (100-300 mg total) by mouth at bedtime. (Patient taking differently: Take 100-200 mg by mouth at bedtime. ) 10 tablet 0   No current facility-administered medications on file prior to visit.      Allergies  Allergen Reactions  . Ceclor [Cefaclor] Anaphylaxis  . Doxycycline Other (See Comments) and Hypertension    Dizziness  ROS:  Out of a complete 14 system review of symptoms, the patient complains only of the following symptoms, and all other reviewed systems are negative.  Fevers, chills, fatigue Blurred vision Diarrhea Swollen lymph nodes Feeling cold, flushing Joint pain, achy muscles Headaches, numbness, weakness, dizziness Not enough sleep, decreased energy Sleepiness  Blood pressure 120/77, pulse 92, height 5\' 7"  (1.702 m), weight 138 lb (62.596 kg).  Physical Exam  General: The patient is alert and cooperative at the time of the examination.  Eyes: Pupils are equal, round, and reactive to  light. Discs are flat bilaterally.  Neck: The neck is supple, no carotid bruits are noted.  Respiratory: The respiratory examination is clear.  Cardiovascular: The cardiovascular examination reveals a regular rate and rhythm, no obvious murmurs or rubs are noted.  Skin: Extremities are without significant edema.  Neurologic Exam  Mental status: The patient is alert and oriented x 3 at the time of the examination. The patient has apparent normal recent and remote memory, with an apparently normal attention span and concentration ability.  Cranial nerves: Facial symmetry is present. There is good sensation of the face to pinprick and soft touch bilaterally. The strength of the facial muscles and the muscles to head turning and shoulder shrug are normal bilaterally. Speech is well enunciated, no aphasia or dysarthria is noted. Extraocular movements are full. Visual fields are full. The tongue is midline, and the patient has symmetric elevation of the soft palate. No obvious hearing deficits are noted.  Motor: The motor testing reveals 5 over 5 strength of all 4 extremities. Good symmetric motor tone is noted throughout.  Sensory: Sensory testing is intact to pinprick, soft touch, vibration sensation, and position sense on all 4 extremities. No evidence of extinction is noted.  Coordination: Cerebellar testing reveals good finger-nose-finger and heel-to-shin bilaterally.  Gait and station: Gait is normal. Tandem gait is normal. Romberg is negative. No drift is seen.  Reflexes: Deep tendon reflexes are symmetric and normal bilaterally. Toes are downgoing bilaterally.   Assessment/Plan:  1. Probable viral syndrome  The patient has had an extensive workup through the emergency room, no abnormalities were noted. She has been treating her health poorly, with poor nutrition, lack of sleep. She likely had a viral syndrome 3 weeks ago that resulted in headache, fatigue, and ataxia. The patient is  doing better this point. She is now on multivitamins, she is allowing for adequate rest and nutrition. I suspect the patient will do quite well in the future if she can maintain this better health behavior. She will follow-up through this office if needed.  Marlan Palau. Keith Willis MD 03/17/2014 3:15 PM  [facility] [address] [place], [place] [id number]  Phone [phone] Fax [phone]

## 2014-03-17 NOTE — Patient Instructions (Signed)

## 2014-03-21 LAB — METHYLMALONIC ACID(MMA), RND URINE
Creatinine, Urine mg/dL-MMAURN: 4.04 mmol/L (ref 2.38–26.55)
METHYLMALONIC ACID UR: 1.6 mmol/mol{creat} (ref 0.3–1.9)

## 2014-03-23 ENCOUNTER — Encounter: Payer: Self-pay | Admitting: Neurology

## 2014-03-23 ENCOUNTER — Telehealth: Payer: Self-pay | Admitting: Neurology

## 2014-03-23 NOTE — Telephone Encounter (Signed)
I called patient. Left a message, I will call back later.

## 2014-03-23 NOTE — Telephone Encounter (Signed)
Patient stated she's still experiencing continuous headache with throbbing pain and advil isn't working anymore.  Please call and advise.

## 2014-03-23 NOTE — Progress Notes (Signed)
This encounter was created in error - please disregard.  This encounter was created in error - please disregard.

## 2014-03-23 NOTE — Telephone Encounter (Signed)
Left message that she is scheduled for appointment tomorrow at 1600.

## 2014-03-23 NOTE — Telephone Encounter (Signed)
I called patient. The patient is having pain in the left occipital area going up to the top of the head. This worsens with head turning, is associated with sharp jabs of pain. This appears to be consistent with occipital neuralgia. I will have the patient come in tomorrow at 4:30 for work in revisit appointment. We will consider doing an occipital nerve block at that time.

## 2014-03-24 ENCOUNTER — Encounter: Payer: Self-pay | Admitting: Neurology

## 2014-03-24 ENCOUNTER — Ambulatory Visit (INDEPENDENT_AMBULATORY_CARE_PROVIDER_SITE_OTHER): Payer: Self-pay | Admitting: Neurology

## 2014-03-24 VITALS — BP 125/80 | HR 74 | Ht 66.0 in | Wt 135.8 lb

## 2014-03-24 DIAGNOSIS — M5481 Occipital neuralgia: Secondary | ICD-10-CM

## 2014-03-24 HISTORY — DX: Occipital neuralgia: M54.81

## 2014-03-24 MED ORDER — PREDNISONE 5 MG PO TABS: ORAL_TABLET | ORAL | Status: DC

## 2014-03-24 NOTE — Progress Notes (Signed)
Please refer to occipital nerve injection procedure note.  The patient was placed on a prednisone Dosepak, 5 mg 6 day pack. The patient has had some recurrence of symptoms with fatigue, malaise, headache. She is only able to work one half days at this point. She will contact the next week if she requires another injection.

## 2014-03-24 NOTE — Procedures (Signed)
   History: Tamara Guerra is a 34 year old patient with a recent viral illness, malaise, and weight loss. She comes in today with recurrence of left occipital pain radiating into the parietal area the head. The patient is coming in for a left occipital nerve block.    Lidocaine injection protocol for headache  Lidocaine 2% was injected on the scalp bilaterally at several locations:  -On the occipital area of the head, 3 injections the left side, 1 cc per injection at the midpoint between the mastoid process and the occipital protuberance. 2 other injections were done one finger breadth from the initial injection, one at a 10 o'clock position and the other at a 2 o'clock position.  The patient tolerated the injections well, no complications of the procedure were noted. Injections were made with a 27-gauge needle.  NDC number is [id number]  Lot number is [id number]. Expriation date is December 2016.

## 2014-05-23 ENCOUNTER — Other Ambulatory Visit: Payer: Self-pay | Admitting: Emergency Medicine

## 2014-05-23 ENCOUNTER — Telehealth: Payer: Self-pay | Admitting: Emergency Medicine

## 2014-05-23 MED ORDER — GABAPENTIN 100 MG PO CAPS: ORAL_CAPSULE | ORAL | Status: DC

## 2014-05-23 NOTE — Telephone Encounter (Signed)
Phone call from patient that she has severe neck pain with radicular symptoms. Will treat with gabapentin 100 mg 1-2  3 times a day as needed.

## 2014-05-31 ENCOUNTER — Ambulatory Visit: Payer: Self-pay

## 2015-02-23 ENCOUNTER — Encounter: Payer: Self-pay | Admitting: Family Medicine

## 2015-02-28 ENCOUNTER — Encounter: Payer: Self-pay | Admitting: Family Medicine

## 2015-03-28 ENCOUNTER — Ambulatory Visit (INDEPENDENT_AMBULATORY_CARE_PROVIDER_SITE_OTHER): Payer: Self-pay | Admitting: Physician Assistant

## 2015-03-28 VITALS — BP 122/82 | HR 112 | Temp 99.0°F | Ht 66.5 in | Wt 129.0 lb

## 2015-03-28 DIAGNOSIS — J069 Acute upper respiratory infection, unspecified: Secondary | ICD-10-CM

## 2015-03-28 DIAGNOSIS — B9789 Other viral agents as the cause of diseases classified elsewhere: Secondary | ICD-10-CM

## 2015-03-28 DIAGNOSIS — Z30018 Encounter for initial prescription of other contraceptives: Secondary | ICD-10-CM

## 2015-03-28 MED ORDER — ETONOGESTREL-ETHINYL ESTRADIOL 0.12-0.015 MG/24HR VA RING: VAGINAL_RING | VAGINAL | Status: DC

## 2015-03-28 NOTE — Progress Notes (Signed)
Tamara Guerra  MRN: [id number] DOB: 02/22/1980  Subjective:  Pt presents to clinic with cold symptoms that started about a week ago - overall she is feeling much better but she missed 2 days of work and she needs a note stating why she missed work.  Daughter tested neg for the flu prior to her getting sick.  LMP a couple of weeks ago 2/8 and at that time she got her IUD out.  She had a copper IUD but she was having a lot of cramping.  She is not sure what she wants to use for birth control - she has been using spermicide and condoms and has been careful because she does not want to get pregnant.  She has been on depoprovera in the past but she did not like the moods - she thinks that she wants to try the nexplanon.  Please fax to her [phone] - attn Dr Lafayette Dragon  Patient Active Problem List   Diagnosis Date Noted  . Occipital neuralgia of left side 03/24/2014  . Viral syndrome 03/17/2014  . Anxiety and depression 05/13/2012  . Chronic insomnia 05/13/2012  . ADHD (attention deficit hyperactivity disorder) 05/13/2012    Current Outpatient Prescriptions on File Prior to Visit  Medication Sig Dispense Refill  . amphetamine-dextroamphetamine (ADDERALL) 20 MG tablet Take 10-20 mg by mouth 2 (two) times daily. Takes 1 tab in am and 0.5 tab in afternoon    . ibuprofen (ADVIL,MOTRIN) 200 MG tablet Take 400 mg by mouth every 6 (six) hours as needed for moderate pain.    Marland Kitchen loratadine (CLARITIN) 10 MG tablet Take 10 mg by mouth daily.    . Multiple Vitamin (MULTIVITAMIN WITH MINERALS) TABS tablet Take 1 tablet by mouth daily.    Marland Kitchen PARoxetine (PAXIL) 40 MG tablet Take 1 tablet (40 mg total) by mouth daily. (Patient taking differently: Take 40 mg by mouth at bedtime. ) 30 tablet 2  . traZODone (DESYREL) 100 MG tablet Take 1-3 tablets (100-300 mg total) by mouth at bedtime. (Patient taking differently: Take 100-200 mg by mouth at bedtime. ) 10 tablet 0   No current facility-administered medications  on file prior to visit.    Allergies  Allergen Reactions  . Ceclor [Cefaclor] Anaphylaxis  . Doxycycline Other (See Comments) and Hypertension    Dizziness    Review of Systems  Constitutional: Positive for fatigue. Negative for fever and chills.  HENT: Positive for congestion and postnasal drip.   Respiratory: Positive for cough.    Objective:  BP 122/82 mmHg  Pulse 112  Temp(Src) 99 F (37.2 C) (Oral)  Ht 5' 6.5" (1.689 m)  Wt 129 lb (58.514 kg)  BMI 20.51 kg/m2  SpO2 98%  LMP 03/14/2015  Physical Exam  Constitutional: She is oriented to person, place, and time and well-developed, well-nourished, and in no distress.  HENT:  Head: Normocephalic and atraumatic.  Right Ear: Hearing, tympanic membrane, external ear and ear canal normal.  Left Ear: Hearing, tympanic membrane, external ear and ear canal normal.  Nose: Nose normal.  Mouth/Throat: Uvula is midline, oropharynx is clear and moist and mucous membranes are normal.  Eyes: Conjunctivae are normal.  Neck: Normal range of motion.  Cardiovascular: Normal rate, regular rhythm and normal heart sounds.   No murmur heard. Pulmonary/Chest: Effort normal and breath sounds normal.  Neurological: She is alert and oriented to person, place, and time. Gait normal.  Skin: Skin is warm and dry.  Psychiatric: Mood, memory, affect and judgment normal.  Vitals reviewed.   Assessment and Plan :  Encounter for initial prescription of other contraceptives - Plan: etonogestrel-ethinyl estradiol (NUVARING) 0.12-0.015 MG/24HR vaginal ring - pt needs birth control - she is smoking about a pack a day which is down from 2 ppds and she is trying to quit - her goal is no cigarettes by her daughters birthday in July.  We talked about the risks of estrogen and smoking but her risks of getting pregnant are much higher.  She is going to seriously consider the nexplanon.  Viral URI with cough - Plan: Care order/instruction work note given to  patient.  Benny Lennert PA-C  [facility] 03/28/2015 7:09 PM

## 2015-03-29 ENCOUNTER — Other Ambulatory Visit: Payer: Self-pay | Admitting: Emergency Medicine

## 2015-04-17 ENCOUNTER — Telehealth: Payer: Self-pay | Admitting: Family Medicine

## 2015-04-17 NOTE — Telephone Encounter (Signed)
Spoke with patient and she declined the flu shot at this time.  She stated that she had a severe reaction to it last year, so she prefers not to get it anymore

## 2015-04-18 ENCOUNTER — Telehealth: Payer: Self-pay | Admitting: Emergency Medicine

## 2015-04-18 ENCOUNTER — Other Ambulatory Visit: Payer: Self-pay | Admitting: Emergency Medicine

## 2015-04-18 DIAGNOSIS — J111 Influenza due to unidentified influenza virus with other respiratory manifestations: Secondary | ICD-10-CM

## 2015-04-18 MED ORDER — OSELTAMIVIR PHOSPHATE 75 MG PO CAPS: ORAL_CAPSULE | ORAL | Status: DC

## 2015-04-18 NOTE — Telephone Encounter (Signed)
Phone call from patient. Daughter down with flu. Will treat mother with Tamiflu one a day as prophylaxis.

## 2015-11-05 ENCOUNTER — Encounter (HOSPITAL_COMMUNITY): Payer: Self-pay | Admitting: *Deleted

## 2015-11-05 ENCOUNTER — Ambulatory Visit (HOSPITAL_COMMUNITY)
Admission: EM | Admit: 2015-11-05 | Discharge: 2015-11-05 | Disposition: A | Discharge status: Home / Self Care | Payer: Self-pay | Attending: Internal Medicine | Admitting: Internal Medicine

## 2015-11-05 DIAGNOSIS — J208 Acute bronchitis due to other specified organisms: Secondary | ICD-10-CM

## 2015-11-05 DIAGNOSIS — H6691 Otitis media, unspecified, right ear: Secondary | ICD-10-CM

## 2015-11-05 DIAGNOSIS — J209 Acute bronchitis, unspecified: Secondary | ICD-10-CM

## 2015-11-05 MED ORDER — IPRATROPIUM-ALBUTEROL 0.5-2.5 (3) MG/3ML IN SOLN
RESPIRATORY_TRACT | Status: AC
  Filled 2015-11-05: qty 3

## 2015-11-05 MED ORDER — AMOXICILLIN-POT CLAVULANATE 875-125 MG PO TABS: 1.0000 | ORAL_TABLET | Freq: Two times a day (BID) | ORAL | 0 refills | Status: DC

## 2015-11-05 MED ORDER — IPRATROPIUM-ALBUTEROL 0.5-2.5 (3) MG/3ML IN SOLN
3.0000 mL | Freq: Once | RESPIRATORY_TRACT | Status: AC
Start: 2015-11-05 — End: 2015-11-05
  Administered 2015-11-05: 3 mL via RESPIRATORY_TRACT

## 2015-11-05 MED ORDER — PREDNISONE 50 MG PO TABS: 50.0000 mg | ORAL_TABLET | Freq: Every day | ORAL | 0 refills | Status: DC

## 2015-11-05 NOTE — ED Triage Notes (Addendum)
18:25  Patient reports symptoms that started on Friday.  Patient has a very congested cough, reports coughing up green phlegm.  Patient has not run a fever.  Patient reports throat is sore, and today has burning throughout chest. Speaking in complete sentences.  Reports if talking a lot will cough alot

## 2015-11-05 NOTE — Discharge Instructions (Addendum)
Prescriptions for prednisone and augmentin [facility]  Anticipate gradual improvement in cough/congestion/wheezing over the next week or two.  Recheck for increasing phlegm production or new fever >100.5. Recheck blood pressure was 130/83.

## 2015-11-05 NOTE — ED Provider Notes (Signed)
[facility]    CSN: [id number] Arrival date & time: 11/05/15  1828     History   Chief Complaint Chief Complaint  Patient presents with  . Cough  . Sore Throat    HPI Tamara Guerra is a 35 y.o. female. She presents today with 3 day history of coughing spells, wheezing, mild dyspnea. Productive cough, profuse runny nose/congestion. Fever, sweats. Malaise. Smoker, prone to bronchitis. Wheezing audibly.  HPI  Past Medical History:  Diagnosis Date  . Allergy   . Anxiety   . Occipital neuralgia of left side 03/24/2014  . Substance abuse     Patient Active Problem List   Diagnosis Date Noted  . Occipital neuralgia of left side 03/24/2014  . Viral syndrome 03/17/2014  . Anxiety and depression 05/13/2012  . Chronic insomnia 05/13/2012  . ADHD (attention deficit hyperactivity disorder) 05/13/2012    Past Surgical History:  Procedure Laterality Date  . CESAREAN SECTION    . CHOLECYSTECTOMY    . SKIN CANCER EXCISION     stage 2 malignant melanoma     Home Medications    Prior to Admission medications   Medication Sig Start Date End Date Taking? Authorizing Provider  albuterol (PROVENTIL HFA;VENTOLIN HFA) 108 (90 Base) MCG/ACT inhaler Inhale 1-2 puffs into the lungs every 6 (six) hours as needed for wheezing or shortness of breath.   Yes Historical Provider, MD  amphetamine-dextroamphetamine (ADDERALL) 20 MG tablet Take 10-20 mg by mouth 2 (two) times daily. Takes 1 tab in am and 0.5 tab in afternoon   Yes Historical Provider, MD  ibuprofen (ADVIL,MOTRIN) 200 MG tablet Take 400 mg by mouth every 6 (six) hours as needed for moderate pain.   Yes Historical Provider, MD  PARoxetine (PAXIL) 40 MG tablet Take 1 tablet (40 mg total) by mouth daily. Patient taking differently: Take 40 mg by mouth at bedtime.  05/17/13  Yes Collene GobbleSteven A Daub, MD  traZODone (DESYREL) 100 MG tablet Take 1-3 tablets (100-300 mg total) by mouth at bedtime. Patient taking differently: Take  100-200 mg by mouth at bedtime.  03/14/13  Yes Porfirio Oarhelle Jeffery, PA-C  etonogestrel-ethinyl estradiol (NUVARING) 0.12-0.015 MG/24HR vaginal ring Insert vaginally and leave in place for 3 consecutive weeks, then remove for 1 week. 03/28/15   Morrell RiddleSarah L Weber, PA-C  loratadine (CLARITIN) 10 MG tablet Take 10 mg by mouth daily.    Historical Provider, MD  Multiple Vitamin (MULTIVITAMIN WITH MINERALS) TABS tablet Take 1 tablet by mouth daily.    Historical Provider, MD  oseltamivir (TAMIFLU) 75 MG capsule Take 1 tablet daily for 10 days. 04/18/15   Collene GobbleSteven A Daub, MD    Family History Family History  Problem Relation Age of Onset  . Sarcoidosis Mother     neurosarcoidosis  . Hypertension Father   . Hyperlipidemia Father   . Cancer Sister     Melanoma    Social History Social History  Substance Use Topics  . Smoking status: Current Every Day Smoker    Packs/day: 1.00    Types: Cigarettes  . Smokeless tobacco: Never Used     Comment: trying nicotine replacement gum and e-cig to try to cut back  . Alcohol use Not on file     Allergies   Ceclor [cefaclor] and Doxycycline   Review of Systems Review of Systems  All other systems reviewed and are negative.    Physical Exam Triage Vital Signs ED Triage Vitals  Enc Vitals Group     BP 11/05/15  1840 179/97     Pulse Rate 11/05/15 1840 101     Resp 11/05/15 1840 12     Temp 11/05/15 1840 99.5 F (37.5 C)     Temp Source 11/05/15 1840 Oral     SpO2 11/05/15 1840 99 %     Weight --      Height --      Pain Score 11/05/15 1952 3   Updated Vital Signs BP 179/97 (BP Location: Left Arm)   Pulse 101   Temp 99.5 F (37.5 C) (Oral)   Resp 12   SpO2 99%  Physical Exam  Constitutional: She is oriented to person, place, and time.  Alert, looks ill but not toxic. Frequent spasms of coughing, wheezy. Sounds very congested  HENT:  Head: Atraumatic.  Bilateral TMs are dull, right is erythematous Nasal congestion Throat is red with mildly  prominent tonsils, postnasal drainage evident  Eyes:  Conjugate gaze, no eye redness/drainage  Neck: Neck supple.  Cardiovascular: Normal rate.   Heart rate 100s  Pulmonary/Chest: Effort normal. No respiratory distress. She has wheezes.  Wheezing throughout, symmetric Deep breath produces coughing  Abdominal: She exhibits no distension.  Musculoskeletal: Normal range of motion.  No leg swelling  Neurological: She is alert and oriented to person, place, and time.  Skin: Skin is warm and dry.  No cyanosis  Nursing note and vitals reviewed.    UC Treatments / Results   Procedures Procedures (including critical care time)  Medications Ordered in UC Medications  ipratropium-albuterol (DUONEB) 0.5-2.5 (3) MG/3ML nebulizer solution 3 mL (not administered)    Final Clinical Impressions(s) / UC Diagnoses   Final diagnoses:  Acute bronchitis due to infection  Acute right otitis media   Prescriptions for prednisone and augmentin were sent to the [facility].  Anticipate gradual improvement in cough/congestion/wheezing over the next week or two.  Recheck for increasing phlegm production or new fever >100.5. Recheck blood pressure was 130/83.    New Prescriptions Meds ordered this encounter  Medications  . predniSONE (DELTASONE) 50 MG tablet    Sig: Take 1 tablet (50 mg total) by mouth daily.    Dispense:  3 tablet    Refill:  0  . amoxicillin-clavulanate (AUGMENTIN) 875-125 MG tablet    Sig: Take 1 tablet by mouth every 12 (twelve) hours.    Dispense:  14 tablet    Refill:  0      Eustace Moore, MD 11/06/15 [phone]

## 2016-06-03 ENCOUNTER — Encounter: Payer: Self-pay | Admitting: Physician Assistant

## 2016-06-08 IMAGING — MR MR HEAD WO/W CM
10 of 13 series · 32 of 48 positions shown · IV contrast (Yes   MULTIHANCE)
Comparison: Prior CT from 03/10/2014.

CLINICAL DATA: Initial evaluation flow are cluster headache.

EXAM:
MRI HEAD WITHOUT AND WITH CONTRAST
TECHNIQUE: Multiplanar, multiecho pulse sequences of the brain and surrounding
structures were obtained without and with intravenous contrast.
CONTRAST:  12mL MULTIHANCE GADOBENATE DIMEGLUMINE 529 MG/ML IV SOLN

[Series 3: DWI · axial · 3.0mm · 1.09mm/px · z∈[-68,+73]mm · 7 of 96 slices shown (1 of 4)]
[im 1/96]
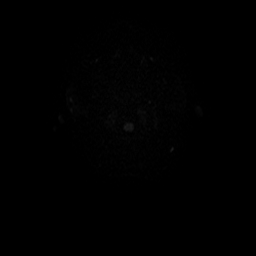
[im 16/96]
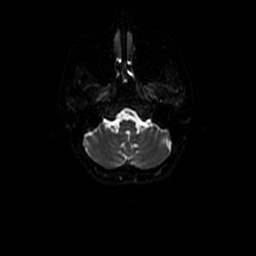
[im 32/96]
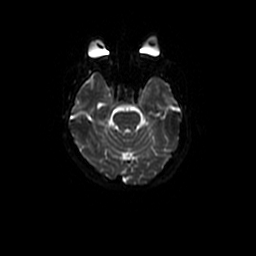
[im 48/96]
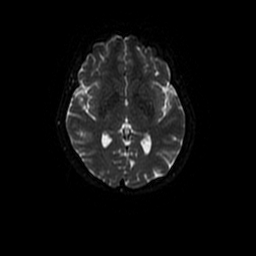
[im 64/96]
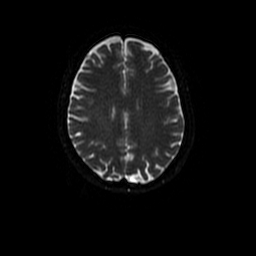
[im 80/96]
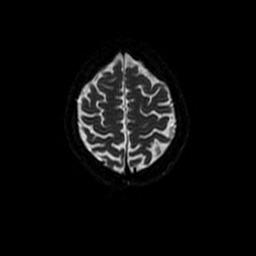
[im 96/96]
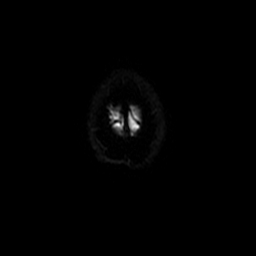

[Series 4: DWI · coronal · 5.0mm · 1.09mm/px · 6 of 66 slices shown (2 of 4)]
[im 1/66]
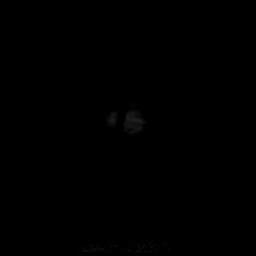
[im 14/66]
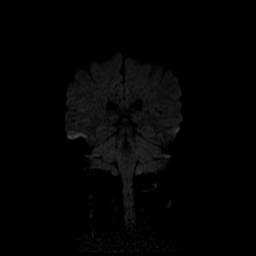
[im 27/66]
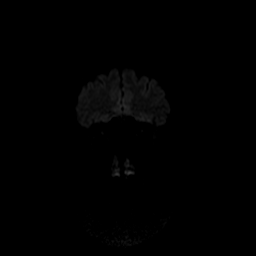
[im 40/66]
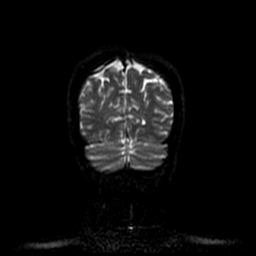
[im 53/66]
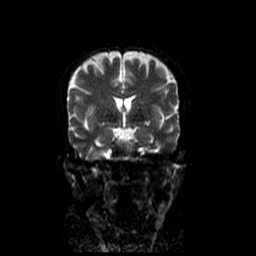
[im 66/66]
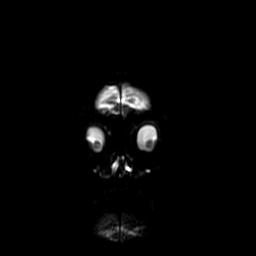

[Series 5: FLAIR · axial · 5.0mm · 0.43mm/px · z∈[-58,+68]mm · 2 of 22 slices shown]
[im 1/22]
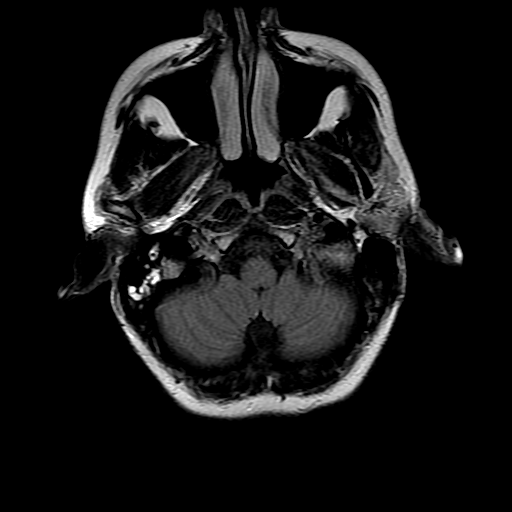
[im 22/22]
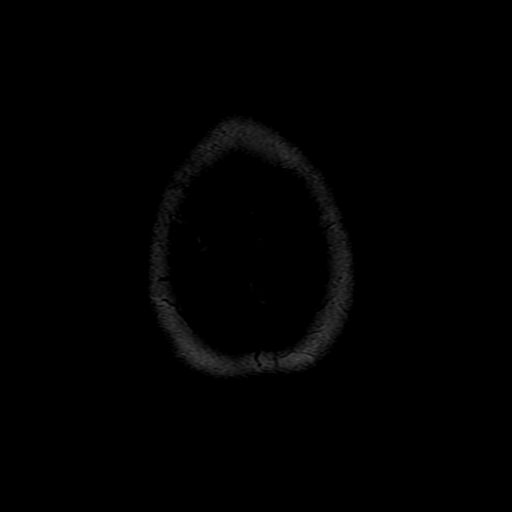

[Series 6: T2 · axial · 5.0mm · 0.43mm/px · z∈[-64,+74]mm · 2 of 24 slices shown (1 of 2)]
[im 1/24]
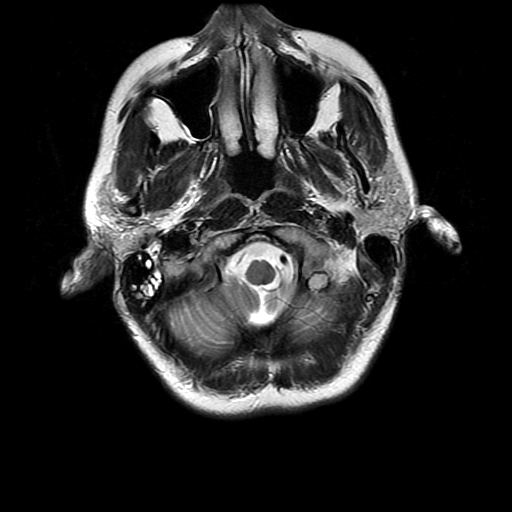
[im 24/24]
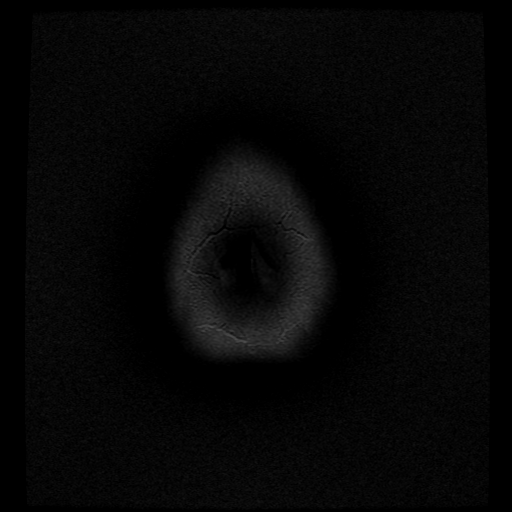

[Series 7: T1 · sagittal · 5.0mm · 0.47mm/px · 2 of 23 slices shown]
[im 1/23]
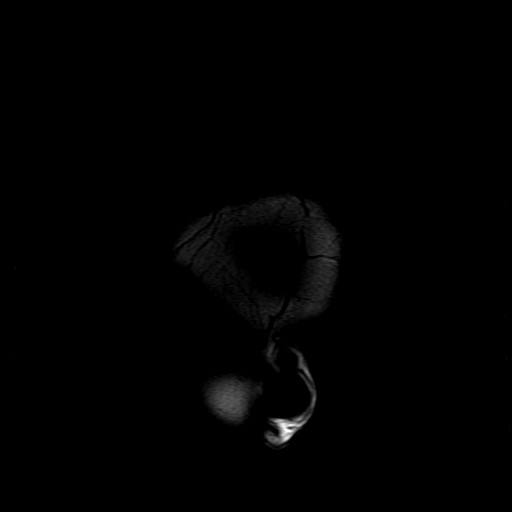
[im 23/23]
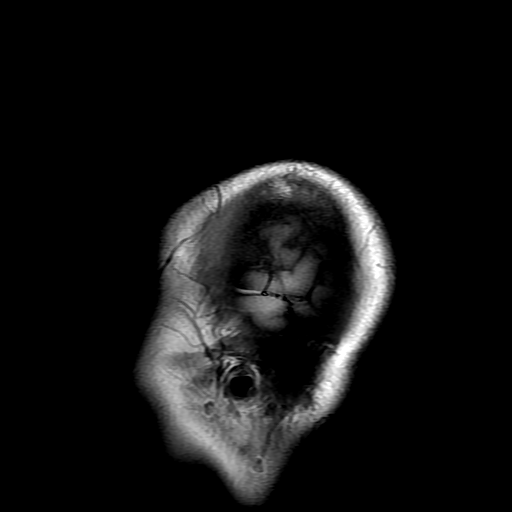

[Series 8: T2 · coronal · 5.0mm · 0.39mm/px · 2 of 25 slices shown (2 of 2)]
[im 1/25]
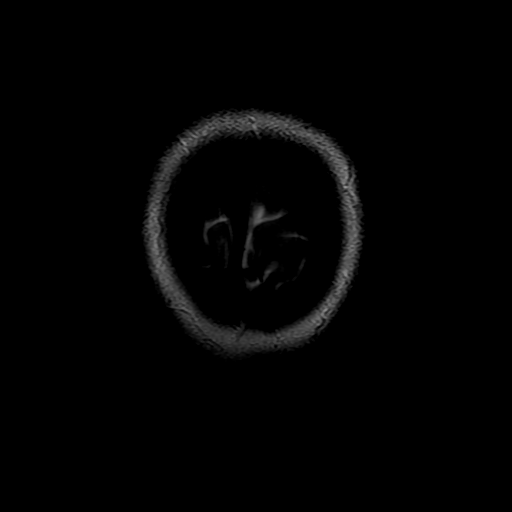
[im 25/25]
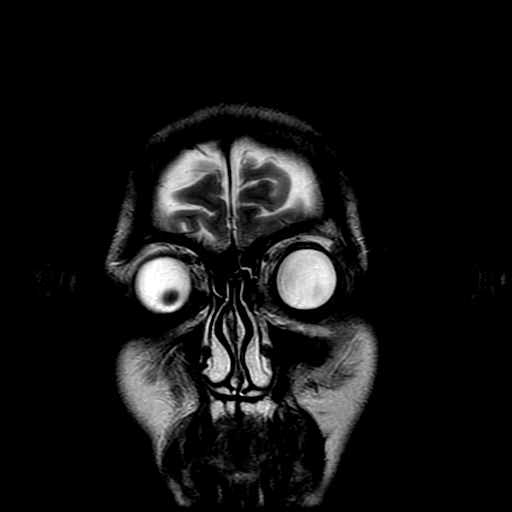

[Series 9: ax mpgr · axial · 5.0mm · 0.43mm/px · z∈[-58,+68]mm · 2 of 22 slices shown]
[im 1/22]
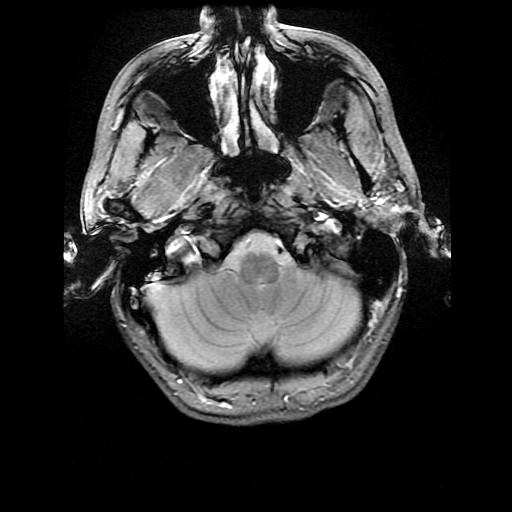
[im 22/22]
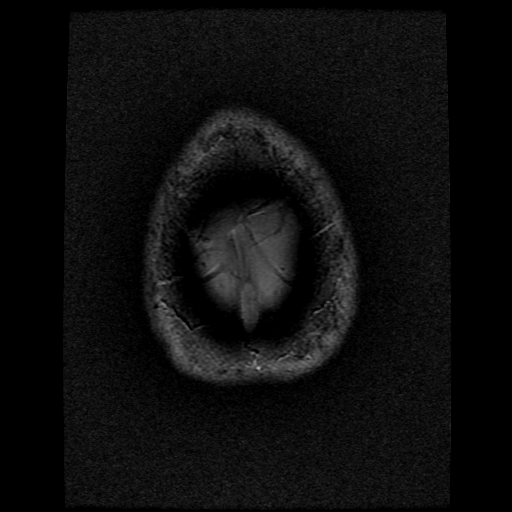

[Series 12: T1 post-contrast · coronal · 5.0mm · 0.39mm/px · 2 of 25 slices shown]
[im 1/25]
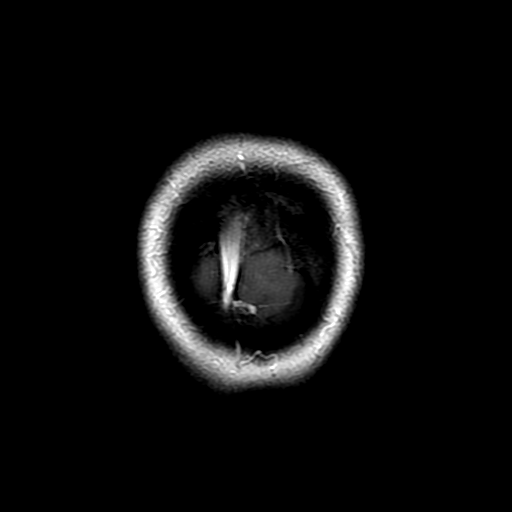
[im 25/25]
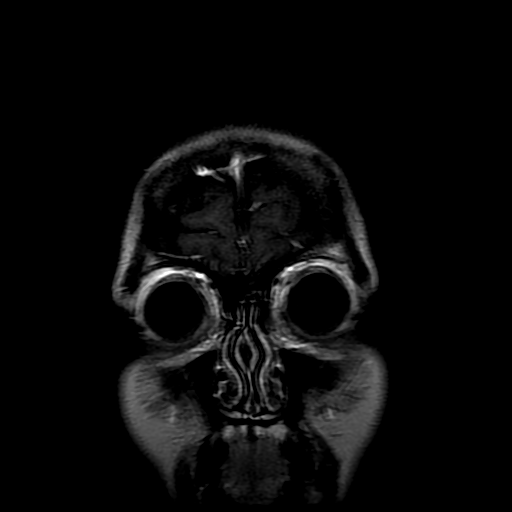

[Series 300: DWI · axial · 3.0mm · 1.09mm/px · z∈[-68,+73]mm · 4 of 48 slices shown (3 of 4)]
[im 1/48]
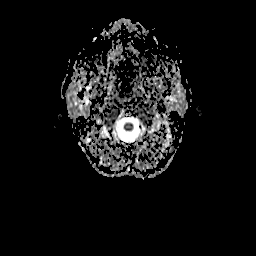
[im 16/48]
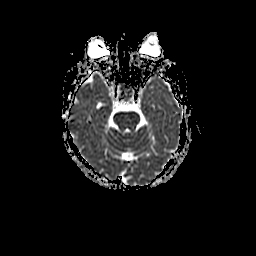
[im 32/48]
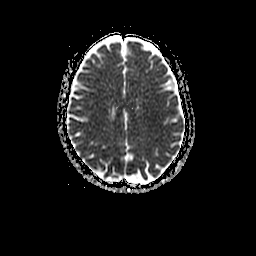
[im 48/48]
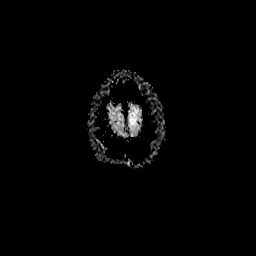

[Series 400: DWI · coronal · 5.0mm · 1.09mm/px · 3 of 33 slices shown (4 of 4)]
[im 1/33]
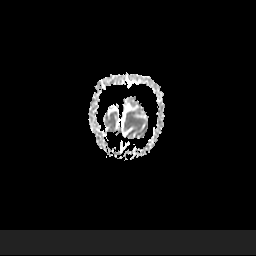
[im 17/33]
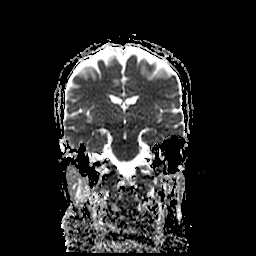
[im 33/33]
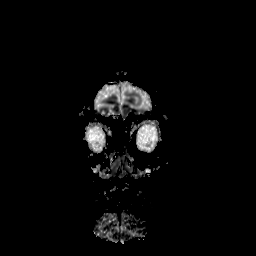

[32 of 48 positions shown; findings below may reference images not displayed]

FINDINGS: The CSF containing spaces are within normal limits for patient age.
No focal parenchymal signal abnormality is identified. No mass
lesion, midline shift, or extra-axial fluid collection. Ventricles
are normal in size without evidence of hydrocephalus.

No diffusion-weighted signal abnormality is identified to suggest
acute intracranial infarct. Gray-white matter differentiation is
maintained. Normal flow voids are seen within the intracranial
vasculature. No intracranial hemorrhage identified.

The cervicomedullary junction is normal. Pituitary gland is within
normal limits. Pituitary stalk is midline. The globes and optic
nerves demonstrate a normal appearance with normal signal intensity.
No abnormal enhancement on post-contrast sequences.

The bone marrow signal intensity is normal. Calvarium is intact.
Visualized upper cervical spine is within normal limits.

Scalp soft tissues are unremarkable.

Paranasal sinuses are clear. Scattered fluid density noted within
the inferior right mastoid air cells. Left mastoid air cells are
clear.
IMPRESSION: Normal contrast enhanced MRI of the brain.

## 2016-06-19 ENCOUNTER — Ambulatory Visit: Payer: Self-pay | Admitting: Physician Assistant

## 2016-06-23 ENCOUNTER — Encounter: Payer: Self-pay | Admitting: Physician Assistant

## 2016-06-23 ENCOUNTER — Ambulatory Visit (INDEPENDENT_AMBULATORY_CARE_PROVIDER_SITE_OTHER): Payer: BLUE CROSS/BLUE SHIELD | Admitting: Physician Assistant

## 2016-06-23 VITALS — BP 137/85 | HR 90 | Temp 98.5°F | Resp 18 | Ht 66.5 in | Wt 117.2 lb

## 2016-06-23 DIAGNOSIS — Z23 Encounter for immunization: Secondary | ICD-10-CM | POA: Diagnosis not present

## 2016-06-23 DIAGNOSIS — IMO0001 Reserved for inherently not codable concepts without codable children: Secondary | ICD-10-CM | POA: Insufficient documentation

## 2016-06-23 DIAGNOSIS — Z113 Encounter for screening for infections with a predominantly sexual mode of transmission: Secondary | ICD-10-CM

## 2016-06-23 DIAGNOSIS — R634 Abnormal weight loss: Secondary | ICD-10-CM

## 2016-06-23 DIAGNOSIS — Z114 Encounter for screening for human immunodeficiency virus [HIV]: Secondary | ICD-10-CM

## 2016-06-23 DIAGNOSIS — Z309 Encounter for contraceptive management, unspecified: Secondary | ICD-10-CM

## 2016-06-23 DIAGNOSIS — Z13220 Encounter for screening for lipoid disorders: Secondary | ICD-10-CM

## 2016-06-23 DIAGNOSIS — Z01419 Encounter for gynecological examination (general) (routine) without abnormal findings: Secondary | ICD-10-CM

## 2016-06-23 LAB — POCT WET + KOH PREP
Trich by wet prep: ABSENT
Yeast by KOH: ABSENT
Yeast by wet prep: ABSENT

## 2016-06-23 NOTE — Patient Instructions (Signed)
     IF you received an x-ray today, you will receive an invoice from [facility]. Please contact [facility] at [phone] with questions or concerns regarding your invoice.   IF you received labwork today, you will receive an invoice from [facility]. Please contact [facility] at [phone] with questions or concerns regarding your invoice.   Our billing staff will not be able to assist you with questions regarding bills from these companies.  You will be contacted with the lab results as soon as they are available. The fastest way to get your results is to activate your My Chart account. Instructions are located on the last page of this paperwork. If you have not heard from us regarding the results in 2 weeks, please contact this office.     

## 2016-06-23 NOTE — Progress Notes (Signed)
Tamara Guerra  MRN: [id number] DOB: 10-21-1980  PCP: Mancel Bale, PA-C  Chief Complaint  Patient presents with  . Gynecologic Exam  . SEXUALLY TRANSMITTED DISEASE    check   . bloodwork    Subjective:  Pt presents to clinic for gyn exam. She has been doing well.  Currently using condoms and spermicide for birth control - she used nuvaring for a while but really concerned about estrogen use with her smoking - she is still interested in nexplanon for birth control. Tired all the time, weight loss over the last few weeks without trying. Active job but without change in eating.  She is not using drugs and has not had a change in her medications.  Review of Systems  Constitutional: Positive for unexpected weight change (loss unexpected). Negative for activity change and appetite change.  HENT: Negative.   Eyes: Negative.   Respiratory: Negative.   Cardiovascular: Negative.   Gastrointestinal: Negative.   Endocrine: Negative.   Genitourinary: Negative.   Musculoskeletal: Negative.   Skin: Negative.   Allergic/Immunologic: Negative.   Neurological: Negative.   Hematological: Negative.   Psychiatric/Behavioral: Negative.     Patient Active Problem List   Diagnosis Date Noted  . Birth control 06/23/2016  . Occipital neuralgia of left side 03/24/2014  . Viral syndrome 03/17/2014  . Anxiety and depression 05/13/2012  . Chronic insomnia 05/13/2012  . ADHD (attention deficit hyperactivity disorder) 05/13/2012    Current Outpatient Prescriptions on File Prior to Visit  Medication Sig Dispense Refill  . amphetamine-dextroamphetamine (ADDERALL) 20 MG tablet Take 10-20 mg by mouth 2 (two) times daily. Takes 1 tab in am and 0.5 tab in afternoon    . loratadine (CLARITIN) 10 MG tablet Take 10 mg by mouth daily.    . Multiple Vitamin (MULTIVITAMIN WITH MINERALS) TABS tablet Take 1 tablet by mouth daily.    Marland Kitchen PARoxetine (PAXIL) 40 MG tablet Take 1 tablet (40 mg total) by mouth daily.  (Patient taking differently: Take 40 mg by mouth at bedtime. ) 30 tablet 2  . traZODone (DESYREL) 100 MG tablet Take 1-3 tablets (100-300 mg total) by mouth at bedtime. (Patient taking differently: Take 100-200 mg by mouth at bedtime. ) 10 tablet 0  . albuterol (PROVENTIL HFA;VENTOLIN HFA) 108 (90 Base) MCG/ACT inhaler Inhale 1-2 puffs into the lungs every 6 (six) hours as needed for wheezing or shortness of breath.     No current facility-administered medications on file prior to visit.     Allergies  Allergen Reactions  . Ceclor [Cefaclor] Anaphylaxis  . Doxycycline Other (See Comments) and Hypertension    Dizziness    Pt patients past, family and social history were reviewed and updated.   Objective:  BP 137/85   Pulse 90   Temp 98.5 F (36.9 C) (Oral)   Resp 18   Ht 5' 6.5" (1.689 m)   Wt 117 lb 3.2 oz (53.2 kg)   LMP 06/12/2016   SpO2 97%   BMI 18.63 kg/m   Physical Exam  Constitutional: She is oriented to person, place, and time and well-developed, well-nourished, and in no distress.  HENT:  Head: Normocephalic and atraumatic.  Right Ear: Hearing and external ear normal.  Left Ear: Hearing and external ear normal.  Eyes: Conjunctivae are normal.  Neck: Normal range of motion.  Cardiovascular: Normal rate, regular rhythm and normal heart sounds.   No murmur heard. Pulmonary/Chest: Effort normal and breath sounds normal. She has no wheezes. Right breast exhibits  no inverted nipple, no mass, no nipple discharge, no skin change and no tenderness. Left breast exhibits no inverted nipple, no mass, no nipple discharge, no skin change and no tenderness. Breasts are symmetrical.  Fibrous breasts - bilaterally- multiple moles with irregular borders that have been watched by dermatologist - one on the right breast has been biopsied several times due to change in size  Genitourinary: Vagina normal, uterus normal, cervix normal, right adnexa normal, left adnexa normal and vulva  normal.  Genitourinary Comments: Multiple ingrown hairs in mons pubis  Lymphadenopathy:    She has no axillary adenopathy.  Neurological: She is alert and oriented to person, place, and time. Gait normal.  Skin: Skin is warm and dry.  Psychiatric: Mood, memory, affect and judgment normal.  Vitals reviewed.   Assessment and Plan :  Encounter for gynecological examination without abnormal finding - Plan: POCT Wet + KOH Prep, Pap IG, CT/NG NAA, and HPV (high risk), CANCELED: Care order/instruction:  Loss of weight - Plan: CMP14+EGFR, CBC with Differential/Platelet, TSH, T4, Free  Need for Tdap vaccination - Plan: Tdap vaccine greater than or equal to 7yo IM  Screening cholesterol level - Plan: Lipid panel  Screening for HIV (human immunodeficiency virus) - Plan: HIV antibody  Screen for STD (sexually transmitted disease) - Plan: RPR  Encounter for contraceptive management, unspecified type - pt wants to continue condoms and spermicide - looking for coverage for insurance for nexplanon  Windell Hummingbird PA-C  [facility] 06/23/2016 2:31 PM

## 2016-06-24 LAB — CMP14+EGFR
ALK PHOS: 41 IU/L (ref 39–117)
ALT: 19 IU/L (ref 0–32)
AST: 22 IU/L (ref 0–40)
Albumin/Globulin Ratio: 1.7 (ref 1.2–2.2)
Albumin: 4.6 g/dL (ref 3.5–5.5)
BUN/Creatinine Ratio: 10 (ref 9–23)
BUN: 9 mg/dL (ref 6–20)
Bilirubin Total: 0.4 mg/dL (ref 0.0–1.2)
CO2: 24 mmol/L (ref 18–29)
CREATININE: 0.88 mg/dL (ref 0.57–1.00)
Calcium: 9.5 mg/dL (ref 8.7–10.2)
Chloride: 99 mmol/L (ref 96–106)
GFR calc Af Amer: 98 mL/min/{1.73_m2} (ref >59)
GFR calc non Af Amer: 85 mL/min/{1.73_m2} (ref >59)
GLOBULIN, TOTAL: 2.7 g/dL (ref 1.5–4.5)
Glucose: 92 mg/dL (ref 65–99)
POTASSIUM: 4.4 mmol/L (ref 3.5–5.2)
SODIUM: 138 mmol/L (ref 134–144)
Total Protein: 7.3 g/dL (ref 6.0–8.5)

## 2016-06-24 LAB — CBC WITH DIFFERENTIAL/PLATELET
Basophils Absolute: 0 10*3/uL (ref 0.0–0.2)
Basos: 0 %
EOS (ABSOLUTE): 0.1 10*3/uL (ref 0.0–0.4)
Eos: 1 %
Hematocrit: 43.1 % (ref 34.0–46.6)
Hemoglobin: 14.5 g/dL (ref 11.1–15.9)
Immature Grans (Abs): 0 10*3/uL (ref 0.0–0.1)
Immature Granulocytes: 0 %
Lymphocytes Absolute: 1.6 10*3/uL (ref 0.7–3.1)
Lymphs: 18 %
MCH: 31.7 pg (ref 26.6–33.0)
MCHC: 33.6 g/dL (ref 31.5–35.7)
MCV: 94 fL (ref 79–97)
MONOS ABS: 0.7 10*3/uL (ref 0.1–0.9)
Monocytes: 8 %
Neutrophils Absolute: 6.5 10*3/uL (ref 1.4–7.0)
Neutrophils: 73 %
PLATELETS: 260 10*3/uL (ref 150–379)
RBC: 4.57 x10E6/uL (ref 3.77–5.28)
RDW: 13 % (ref 12.3–15.4)
WBC: 8.9 10*3/uL (ref 3.4–10.8)

## 2016-06-24 LAB — LIPID PANEL
CHOLESTEROL TOTAL: 168 mg/dL (ref 100–199)
Chol/HDL Ratio: 2.3 ratio (ref 0.0–4.4)
HDL: 74 mg/dL (ref >39)
LDL Calculated: 70 mg/dL (ref 0–99)
TRIGLYCERIDES: 122 mg/dL (ref 0–149)
VLDL Cholesterol Cal: 24 mg/dL (ref 5–40)

## 2016-06-24 LAB — T4, FREE: FREE T4: 1.07 ng/dL (ref 0.82–1.77)

## 2016-06-24 LAB — RPR: RPR Ser Ql: NONREACTIVE

## 2016-06-24 LAB — TSH: TSH: 1.89 u[IU]/mL (ref 0.450–4.500)

## 2016-06-24 LAB — HIV ANTIBODY (ROUTINE TESTING W REFLEX): HIV Screen 4th Generation wRfx: NONREACTIVE

## 2016-06-25 LAB — PAP IG, CT-NG NAA, HPV HIGH-RISK
Chlamydia, Nuc. Acid Amp: NEGATIVE
GONOCOCCUS BY NUCLEIC ACID AMP: NEGATIVE
HPV, HIGH-RISK: NEGATIVE
PAP Smear Comment: 0

## 2016-08-26 ENCOUNTER — Ambulatory Visit: Payer: BLUE CROSS/BLUE SHIELD | Admitting: Family Medicine

## 2016-08-26 ENCOUNTER — Ambulatory Visit (INDEPENDENT_AMBULATORY_CARE_PROVIDER_SITE_OTHER): Payer: BLUE CROSS/BLUE SHIELD

## 2016-08-26 ENCOUNTER — Encounter: Payer: Self-pay | Admitting: Family Medicine

## 2016-08-26 VITALS — BP 123/85 | HR 99 | Temp 98.7°F | Resp 16 | Ht 66.0 in | Wt 118.6 lb

## 2016-08-26 DIAGNOSIS — M25532 Pain in left wrist: Secondary | ICD-10-CM

## 2016-08-26 DIAGNOSIS — M79642 Pain in left hand: Secondary | ICD-10-CM | POA: Diagnosis not present

## 2016-08-26 NOTE — Patient Instructions (Addendum)
  X-ray does not show a definite fracture, but there is a concerning/questionable area at the base of your third metacarpal.  I will refer you to a hand specialist.  Wear the wrist brace, and do not use that hand until evaluated by orthopedics. Ibuprofen up to 600 mg every 6 hours as needed with food. Let me know if you have questions in the meantime, and if numbness in the second and third fingers persists or worsens, also let me know.    IF you received an x-ray today, you will receive an invoice from [facility]. Please contact [facility] at [phone] with questions or concerns regarding your invoice.   IF you received labwork today, you will receive an invoice from [facility]. Please contact [facility] at [phone] with questions or concerns regarding your invoice.   Our billing staff will not be able to assist you with questions regarding bills from these companies.  You will be contacted with the lab results as soon as they are available. The fastest way to get your results is to activate your My Chart account. Instructions are located on the last page of this paperwork. If you have not heard from us regarding the results in 2 weeks, please contact this office.
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
## 2017-08-14 ENCOUNTER — Ambulatory Visit (INDEPENDENT_AMBULATORY_CARE_PROVIDER_SITE_OTHER): Payer: Self-pay | Admitting: Physician Assistant

## 2017-08-14 ENCOUNTER — Other Ambulatory Visit: Payer: Self-pay

## 2017-08-14 ENCOUNTER — Encounter: Payer: Self-pay | Admitting: Physician Assistant

## 2017-08-14 ENCOUNTER — Ambulatory Visit: Payer: Self-pay | Admitting: *Deleted

## 2017-08-14 VITALS — BP 127/76 | HR 88 | Temp 98.0°F | Resp 16 | Ht 66.0 in | Wt 128.8 lb

## 2017-08-14 DIAGNOSIS — E860 Dehydration: Secondary | ICD-10-CM

## 2017-08-14 DIAGNOSIS — E872 Acidosis, unspecified: Secondary | ICD-10-CM

## 2017-08-14 LAB — POCT URINALYSIS DIP (MANUAL ENTRY)
BILIRUBIN UA: NEGATIVE
BILIRUBIN UA: NEGATIVE mg/dL
GLUCOSE UA: NEGATIVE mg/dL
Leukocytes, UA: NEGATIVE
Nitrite, UA: NEGATIVE
Protein Ur, POC: NEGATIVE mg/dL
RBC UA: NEGATIVE
UROBILINOGEN UA: 0.2 U/dL
pH, UA: 6 (ref 5.0–8.0)

## 2017-08-14 NOTE — Telephone Encounter (Signed)
Patient had heat exposure yesterday-she felt very sick and  her temperature yesterday when she got home was- 102.9. Patient reports she works outside and she has been trying to stay hydrated. She had been having problems with weakness, confusion, trouble urinating,headache, blurry vision- some of these symptoms started yesterday. Call to office and they only have last appointments of the day- they recommend that patient be evaluated at ED . Call from patient disconnected/dropped. Attempted to call patient back- left message on her voice mail that she should be evaluated at the ED- please go there tonight.  Reason for Disposition . [1] Dizziness or weakness AND [2] persists after 2 hours of rest and drinking liquids  Answer Assessment - Initial Assessment Questions 1. SYMPTOMS: "What symptoms are you concerned about?"     Confusion,headache, weak, vomiting 2. ONSET:  "When did the symptoms start?"     Yesterday got bad- patient works in heat- patient had stopped urinating 3. FEVER: "Do you have a fever?" If so, ask: "What is it, how was it measured, and when did it start?"      102.9 yesterday/ oral thermometer, 99.4- 1 hour later , 97.9- 1 hour after  4. HEAT EXPOSURE: "What caused you to become hot?" (e.g., outside in the sun, inside a hot factory)     Patient works outsideAdvertising copywriter- horse trainer 5. PHYSICAL ACTIVITY:  "What type of physical activity were you doing?" (e.g., working, sports)     working 6. SICK: "Have you been sick recently?" (e.g., cold, flu, recent fever)     Heat from work 7. OTHER SYMPTOMS: "Do you have any other symptoms?" (e.g., fainting, flushed skin, weakness, nausea, vomiting, muscle cramps)     Confusion, weakness, vomiting, occasional leg cramps at night, blurry vision 8. PREGNANCY: "Is there any chance you are pregnant?" "When was your last menstrual period?"     No- LMP 3 weeks ago  Protocols used: HEAT EXPOSURE (HEAT EXHAUSTION AND HEAT STROKE)-A-AH

## 2017-08-14 NOTE — Progress Notes (Signed)
08/14/2017 6:05 PM   DOB: 15-Apr-1980 / MRN: [id number]  SUBJECTIVE:  Tamara Guerra is a 37 y.o. female presenting for heat related illness that occurred yesterday and has improved since.  She works on a horse farm.  She tells me that after about 2 hours of working she suddenly felt "overheated", nauseous and then began to have nonbloody emesis.  She went to her car to the heat turned on the air conditioning.  She checked her temperature and tells me it was 102.3.  She associates anuria and dizziness which have since resolved.  She told her boss that she needed to go home and checked her temperature again at home and it was just below 100 degrees.  She has been hydrating vigorously since that time and has been feeling better.  She has been previously healthy she has a history of substance abuse including alcohol and illicits however no illicits recently.  She does smoke.  She drinks wine nightly at roughly 1/2 a bottle.  She is concerned about her liver today.  She has a history of chronic diarrhea and attributes this to her cholecystectomy and alcohol use.  She is allergic to ceclor [cefaclor] and doxycycline.   She  has a past medical history of Allergy, Anxiety, Occipital neuralgia of left side (03/24/2014), and Substance abuse (HCC).    She  reports that she has been smoking cigarettes.  She has been smoking about 1.50 packs per day. She has never used smokeless tobacco. She reports that she drinks alcohol. She reports that she does not use drugs. She  reports that she currently engages in sexual activity. She reports using the following methods of birth control/protection: IUD and Condom. The patient  has a past surgical history that includes Cholecystectomy; Skin cancer excision; and Cesarean section.  Her family history includes Cancer in her sister; Hyperlipidemia in her father; Hypertension in her father; Sarcoidosis in her mother.  Review of Systems  Constitutional: Negative for chills,  diaphoresis and fever.  Respiratory: Negative for cough, hemoptysis, sputum production, shortness of breath and wheezing.   Cardiovascular: Negative for chest pain, orthopnea and leg swelling.  Gastrointestinal: Negative for abdominal pain, blood in stool, constipation, diarrhea, heartburn, melena, nausea and vomiting.  Genitourinary: Negative for flank pain.  Skin: Negative for rash.  Neurological: Negative for dizziness.    The problem list and medications were reviewed and updated by myself where necessary and exist elsewhere in the encounter.   OBJECTIVE:  BP 127/76   Pulse 88   Temp 98 F (36.7 C)   Resp 16   Ht 5\' 6"  (1.676 m)   Wt 128 lb 12.8 oz (58.4 kg)   SpO2 99%   BMI 20.79 kg/m   Wt Readings from Last 3 Encounters:  08/14/17 128 lb 12.8 oz (58.4 kg)  08/26/16 118 lb 9.6 oz (53.8 kg)  06/23/16 117 lb 3.2 oz (53.2 kg)   Temp Readings from Last 3 Encounters:  08/14/17 98 F (36.7 C)  08/26/16 98.7 F (37.1 C) (Oral)  06/23/16 98.5 F (36.9 C) (Oral)   BP Readings from Last 3 Encounters:  08/14/17 127/76  08/26/16 123/85  06/23/16 137/85   Pulse Readings from Last 3 Encounters:  08/14/17 88  08/26/16 99  06/23/16 90    Physical Exam  Constitutional: She is oriented to person, place, and time. She appears well-developed and well-nourished. No distress.  HENT:  Mouth/Throat: No oropharyngeal exudate.  Eyes: Pupils are equal, round, and reactive to  light. EOM are normal. No scleral icterus.  Cardiovascular: Normal rate, regular rhythm, S1 normal, S2 normal, normal heart sounds and intact distal pulses. Exam reveals no gallop, no friction rub and no decreased pulses.  No murmur heard. Pulmonary/Chest: Effort normal. No stridor. No respiratory distress. She has no wheezes. She has no rales.  Abdominal: Soft. Normal appearance and bowel sounds are normal. She exhibits no distension and no mass. There is no tenderness. There is no rigidity, no rebound, no  guarding and no CVA tenderness.  Musculoskeletal: Normal range of motion. She exhibits no edema.  Neurological: She is alert and oriented to person, place, and time. No cranial nerve deficit. Gait normal.  Skin: Skin is warm and dry. No rash noted. She is not diaphoretic. No erythema. No pallor.  Psychiatric: She has a normal mood and affect. Her behavior is normal. Judgment and thought content normal.  Vitals reviewed.   Orthostatic VS for the past 24 hrs:  BP- Lying Pulse- Lying BP- Sitting Pulse- Sitting BP- Standing at 0 minutes Pulse- Standing at 0 minutes  08/14/17 1811 132/87 74 (!) 147/94 87 132/90 98      Results for orders placed or performed in visit on 08/14/17  POCT urinalysis dipstick  Result Value Ref Range   Color, UA yellow yellow   Clarity, UA clear clear   Glucose, UA negative negative mg/dL   Bilirubin, UA negative negative   Ketones, POC UA negative negative mg/dL   Spec Grav, UA <=1.610<=1.005 (A) 1.010 - 1.025   Blood, UA negative negative   pH, UA 6.0 5.0 - 8.0   Protein Ur, POC negative negative mg/dL   Urobilinogen, UA 0.2 0.2 or 1.0 E.U./dL   Nitrite, UA Negative Negative   Leukocytes, UA Negative Negative      Lab Results  Component Value Date   WBC 8.9 06/23/2016   HGB 14.5 06/23/2016   HCT 43.1 06/23/2016   MCV 94 06/23/2016   PLT 260 06/23/2016    Lab Results  Component Value Date   CREATININE 0.88 06/23/2016   BUN 9 06/23/2016   NA 138 06/23/2016   K 4.4 06/23/2016   CL 99 06/23/2016   CO2 24 06/23/2016    Lab Results  Component Value Date   ALT 19 06/23/2016   AST 22 06/23/2016   ALKPHOS 41 06/23/2016   BILITOT 0.4 06/23/2016    Lab Results  Component Value Date   TSH 1.890 06/23/2016    Lab Results  Component Value Date   CHOL 168 06/23/2016   HDL 74 06/23/2016   LDLCALC 70 06/23/2016   TRIG 122 06/23/2016   CHOLHDL 2.3 06/23/2016     ASSESSMENT AND PLAN:  Tamara Guerra was seen today for heat exhaustion.  Diagnoses  and all orders for this visit:  Dehydration: HPI certainly consistent with a heat related illness which appears to have resolved.  Her orthostatics, physical exam and lab work have come back reassuring.  We will plan to recheck her complete metabolic panel and have advised that she limit her alcohol use.  If she is successful here and her CO2 continues to come back abnormal we will further investigate severity and chronicity of diarrhea and consider a nephrology referral. -     POCT urinalysis dipstick -     Renal Function Panel -     CK -     Orthostatic vital signs -     Orthostatic vital signs    The patient is advised to  call or return to clinic if she does not see an improvement in symptoms, or to seek the care of the closest emergency department if she worsens with the above plan.   Deliah Boston, MHS, PA-C Primary Care at [facility] 08/14/2017 6:05 PM

## 2017-08-14 NOTE — Patient Instructions (Addendum)
  Every time you are going to be working outside take a liter of Gatorade with you    IF you received an x-ray today, you will receive an invoice from [facility]. Please contact [facility] at [phone] with questions or concerns regarding your invoice.   IF you received labwork today, you will receive an invoice from [facility]. Please contact [facility] at [phone] with questions or concerns regarding your invoice.   Our billing staff will not be able to assist you with questions regarding bills from these companies.  You will be contacted with the lab results as soon as they are available. The fastest way to get your results is to activate your My Chart account. Instructions are located on the last page of this paperwork. If you have not heard from us regarding the results in 2 weeks, please contact this office.

## 2017-08-14 NOTE — Progress Notes (Signed)
Hepatic Function Latest Ref Rng & Units 06/23/2016 03/04/2014 05/13/2012  Total Protein 6.0 - 8.5 g/dL 7.3 7.2 7.4  Albumin 3.5 - 5.5 g/dL 4.6 4.4 4.4  AST 0 - 40 IU/L '22 15 19  ' ALT 0 - 32 IU/L '19 16 17  ' Alk Phosphatase 39 - 117 IU/L 41 44 38(L)  Total Bilirubin 0.0 - 1.2 mg/dL 0.4 0.3 0.3    CMP Latest Ref Rng & Units 06/23/2016 03/13/2014 03/04/2014  Glucose 65 - 99 mg/dL 92 69(L) 116(H)  BUN 6 - 20 mg/dL 9 <5(L) 7  Creatinine 0.57 - 1.00 mg/dL 0.88 0.68 0.89  Sodium 134 - 144 mmol/L 138 137 138  Potassium 3.5 - 5.2 mmol/L 4.4 3.5 3.8  Chloride 96 - 106 mmol/L 99 104 102  CO2 18 - 29 mmol/L '24 27 27  ' Calcium 8.7 - 10.2 mg/dL 9.5 8.9 9.5  Total Protein 6.0 - 8.5 g/dL 7.3 - 7.2  Total Bilirubin 0.0 - 1.2 mg/dL 0.4 - 0.3  Alkaline Phos 39 - 117 IU/L 41 - 44  AST 0 - 40 IU/L 22 - 15  ALT 0 - 32 IU/L 19 - 16   CBC Latest Ref Rng & Units 06/23/2016 03/13/2014 03/07/2014  WBC 3.4 - 10.8 x10E3/uL 8.9 7.4 7.6  Hemoglobin 11.1 - 15.9 g/dL 14.5 13.5 14.1  Hematocrit 34.0 - 46.6 % 43.1 38.2 44.6  Platelets 150 - 379 x10E3/uL 260 227 -   Lab Results  Component Value Date   WBC 8.9 06/23/2016   HGB 14.5 06/23/2016   HCT 43.1 06/23/2016   MCV 94 06/23/2016   PLT 260 06/23/2016

## 2017-08-15 LAB — RENAL FUNCTION PANEL
Albumin: 4.7 g/dL (ref 3.5–5.5)
BUN/Creatinine Ratio: 9 (ref 9–23)
BUN: 7 mg/dL (ref 6–20)
CALCIUM: 9.4 mg/dL (ref 8.7–10.2)
CHLORIDE: 100 mmol/L (ref 96–106)
CO2: 19 mmol/L — AB (ref 20–29)
CREATININE: 0.78 mg/dL (ref 0.57–1.00)
GFR calc Af Amer: 112 mL/min/{1.73_m2} (ref >59)
GFR calc non Af Amer: 97 mL/min/{1.73_m2} (ref >59)
Glucose: 102 mg/dL — ABNORMAL HIGH (ref 65–99)
PHOSPHORUS: 4.1 mg/dL (ref 2.5–4.5)
Potassium: 3.4 mmol/L — ABNORMAL LOW (ref 3.5–5.2)
SODIUM: 136 mmol/L (ref 134–144)

## 2017-08-15 LAB — CK: Total CK: 89 U/L (ref 24–173)

## 2017-08-15 NOTE — Telephone Encounter (Signed)
Pt seen at PCP 

## 2017-08-16 LAB — CMP AND LIVER
ALT: 14 IU/L (ref 0–32)
AST: 19 IU/L (ref 0–40)
Albumin: 4.9 g/dL (ref 3.5–5.5)
Alkaline Phosphatase: 39 IU/L (ref 39–117)
BUN: 8 mg/dL (ref 6–20)
Bilirubin Total: <0.2 mg/dL (ref 0.0–1.2)
Bilirubin, Direct: 0.07 mg/dL (ref 0.00–0.40)
CALCIUM: 9.3 mg/dL (ref 8.7–10.2)
CO2: 16 mmol/L — AB (ref 20–29)
Chloride: 100 mmol/L (ref 96–106)
Creatinine, Ser: 0.88 mg/dL (ref 0.57–1.00)
GFR, EST AFRICAN AMERICAN: 97 mL/min/{1.73_m2} (ref >59)
GFR, EST NON AFRICAN AMERICAN: 84 mL/min/{1.73_m2} (ref >59)
GLUCOSE: 101 mg/dL — AB (ref 65–99)
Potassium: 3.4 mmol/L — ABNORMAL LOW (ref 3.5–5.2)
Sodium: 139 mmol/L (ref 134–144)
Total Protein: 7.4 g/dL (ref 6.0–8.5)

## 2017-08-16 LAB — SPECIMEN STATUS REPORT

## 2017-08-17 ENCOUNTER — Encounter: Payer: Self-pay | Admitting: Physician Assistant

## 2017-08-18 ENCOUNTER — Encounter: Payer: Self-pay | Admitting: Physician Assistant

## 2017-08-18 NOTE — Addendum Note (Signed)
Addended by: Garfield CorneaMABRY, Merrily Tegeler L on: 08/18/2017 02:49 PM   Modules accepted: Orders

## 2017-08-19 ENCOUNTER — Encounter: Payer: Self-pay | Admitting: Physician Assistant

## 2017-08-19 ENCOUNTER — Ambulatory Visit: Payer: Self-pay | Admitting: Physician Assistant

## 2017-08-19 ENCOUNTER — Other Ambulatory Visit: Payer: Self-pay

## 2017-08-19 VITALS — BP 144/81 | HR 87 | Temp 99.2°F | Ht 66.25 in | Wt 130.0 lb

## 2017-08-19 DIAGNOSIS — E872 Acidosis, unspecified: Secondary | ICD-10-CM

## 2017-08-19 DIAGNOSIS — R112 Nausea with vomiting, unspecified: Secondary | ICD-10-CM

## 2017-08-19 DIAGNOSIS — R5383 Other fatigue: Secondary | ICD-10-CM

## 2017-08-19 LAB — POCT CBC
Granulocyte percent: 69.4 %G (ref 37–80)
HCT, POC: 40.9 % (ref 37.7–47.9)
Hemoglobin: 12.8 g/dL (ref 12.2–16.2)
LYMPH, POC: 1.7 (ref 0.6–3.4)
MCH, POC: 30.2 pg (ref 27–31.2)
MCHC: 31.4 g/dL — AB (ref 31.8–35.4)
MCV: 96 fL (ref 80–97)
MID (CBC): 0.2 (ref 0–0.9)
MPV: 8.4 fL (ref 0–99.8)
POC Granulocyte: 4.3 (ref 2–6.9)
POC LYMPH PERCENT: 27.2 %L (ref 10–50)
POC MID %: 3.4 % (ref 0–12)
Platelet Count, POC: 245 10*3/uL (ref 142–424)
RBC: 4.26 M/uL (ref 4.04–5.48)
RDW, POC: 12.8 %
WBC: 6.2 10*3/uL (ref 4.6–10.2)

## 2017-08-19 LAB — CBC
Hematocrit: 40.8 % (ref 34.0–46.6)
Hemoglobin: 13.7 g/dL (ref 11.1–15.9)
MCH: 32.2 pg (ref 26.6–33.0)
MCHC: 33.6 g/dL (ref 31.5–35.7)
MCV: 96 fL (ref 79–97)
Platelets: 277 10*3/uL (ref 150–450)
RBC: 4.26 x10E6/uL (ref 3.77–5.28)
RDW: 13.3 % (ref 12.3–15.4)
WBC: 6.9 10*3/uL (ref 3.4–10.8)

## 2017-08-19 LAB — POCT URINALYSIS DIP (MANUAL ENTRY)
BILIRUBIN UA: NEGATIVE
Blood, UA: NEGATIVE
GLUCOSE UA: NEGATIVE mg/dL
Ketones, POC UA: NEGATIVE mg/dL
LEUKOCYTES UA: NEGATIVE
NITRITE UA: NEGATIVE
PH UA: 6 (ref 5.0–8.0)
Protein Ur, POC: NEGATIVE mg/dL
Spec Grav, UA: 1.01 (ref 1.010–1.025)
UROBILINOGEN UA: 0.2 U/dL

## 2017-08-19 LAB — POCT URINE PREGNANCY: PREG TEST UR: NEGATIVE

## 2017-08-19 MED ORDER — RANITIDINE HCL 150 MG PO TABS: 150.0000 mg | ORAL_TABLET | Freq: Two times a day (BID) | ORAL | Status: AC

## 2017-08-19 NOTE — Progress Notes (Signed)
08/19/2017 4:43 PM   DOB: 1980-05-05 / MRN: [id number]  SUBJECTIVE:  Tamara Guerra is a 37 y.o. female presenting for recheck dizzines, intermittent nausea with emesis, diarrhea. Symptoms present for about one week.  The problem only occurs when she is working in the heat. Her most recent labs showed an acidosis.  She consumes EtOH chronically in the form of 1/2 to 1 bottle's of wine nightly. She has stopped drinking in the last week given previous abnormal labs. She smokes daily and has for some time.   She has tried gatoraide, sodium bicarbonate, and imodium. Father is a prominent physician in [place] who has urged her to come here for work up.   She feels much better at this time, however symptoms are brought on by working in the heat.   She is allergic to ceclor [cefaclor] and doxycycline.    She  has a past medical history of Allergy, Anxiety, Occipital neuralgia of left side (03/24/2014), and Substance abuse (HCC).    She  reports that she has been smoking cigarettes.  She has a 30.00 pack-year smoking history. She has never used smokeless tobacco. She reports that she drinks alcohol. She reports that she does not use drugs. She  reports that she currently engages in sexual activity. She reports using the following methods of birth control/protection: IUD and Condom. The patient  has a past surgical history that includes Cholecystectomy; Skin cancer excision; and Cesarean section.  Her family history includes Cancer in her sister; Hyperlipidemia in her father; Hypertension in her father; Sarcoidosis in her mother.  Review of Systems  Constitutional: Negative for chills, diaphoresis and fever.  Eyes: Negative.   Respiratory: Negative for cough, hemoptysis, sputum production, shortness of breath and wheezing.   Cardiovascular: Negative for chest pain, orthopnea and leg swelling.  Gastrointestinal: Negative for abdominal pain, blood in stool, constipation, diarrhea, heartburn, melena,  nausea and vomiting.  Genitourinary: Negative for dysuria, flank pain, frequency, hematuria and urgency.  Skin: Negative for rash.  Neurological: Negative for dizziness, sensory change, speech change, focal weakness, weakness and headaches.    The problem list and medications were reviewed and updated by myself where necessary and exist elsewhere in the encounter.   OBJECTIVE:  BP (!) 144/81 (BP Location: Left Arm, Patient Position: Sitting, Cuff Size: Normal)   Pulse 87   Temp 99.2 F (37.3 C) (Oral)   Ht 5' 6.25" (1.683 m)   Wt 130 lb (59 kg)   SpO2 97%   BMI 20.82 kg/m   Wt Readings from Last 3 Encounters:  08/19/17 130 lb (59 kg)  08/14/17 128 lb 12.8 oz (58.4 kg)  08/26/16 118 lb 9.6 oz (53.8 kg)   Temp Readings from Last 3 Encounters:  08/19/17 99.2 F (37.3 C) (Oral)  08/14/17 98 F (36.7 C)  08/26/16 98.7 F (37.1 C) (Oral)   BP Readings from Last 3 Encounters:  08/19/17 (!) 144/81  08/14/17 127/76  08/26/16 123/85   Pulse Readings from Last 3 Encounters:  08/19/17 87  08/14/17 88  08/26/16 99     Physical Exam  Constitutional: She is oriented to person, place, and time. She appears well-nourished. No distress.  Eyes: Pupils are equal, round, and reactive to light. EOM are normal.  Cardiovascular: Normal rate, regular rhythm, S1 normal, S2 normal, normal heart sounds and intact distal pulses. Exam reveals no gallop, no friction rub and no decreased pulses.  No murmur heard. Pulmonary/Chest: Effort normal. No stridor. No respiratory distress. She  has no wheezes. She has no rales.  Abdominal: She exhibits no distension.  Musculoskeletal: She exhibits no edema.  Neurological: She is alert and oriented to person, place, and time. No cranial nerve deficit. Gait normal.  Vibratory sensation, position sense, and sensation to to monofilament intact to challenge about the extremities.   Skin: Skin is dry. She is not diaphoretic. No pallor.  Psychiatric: She has  a normal mood and affect.  Vitals reviewed.   Results for orders placed or performed in visit on 08/19/17  CMP and Liver  Result Value Ref Range   Glucose 88 65 - 99 mg/dL   BUN 7 6 - 20 mg/dL   Creatinine, Ser 6.21 0.57 - 1.00 mg/dL   GFR calc non Af Amer 102 >59 mL/min/1.73   GFR calc Af Amer 118 >59 mL/min/1.73   Sodium 137 134 - 144 mmol/L   Potassium 3.5 3.5 - 5.2 mmol/L   Chloride 100 96 - 106 mmol/L   CO2 22 20 - 29 mmol/L   Calcium 9.0 8.7 - 10.2 mg/dL   Total Protein 7.5 6.0 - 8.5 g/dL   Albumin 4.9 3.5 - 5.5 g/dL   Bilirubin Total 0.3 0.0 - 1.2 mg/dL   Bilirubin, Direct 3.08 0.00 - 0.40 mg/dL   Alkaline Phosphatase 34 (L) 39 - 117 IU/L   AST 22 0 - 40 IU/L   ALT 26 0 - 32 IU/L  Lactic Acid, Plasma  Result Value Ref Range   Lactate, Ven 9.4 4.8 - 25.7 mg/dL  POCT CBC  Result Value Ref Range   WBC 6.2 4.6 - 10.2 K/uL   Lymph, poc 1.7 0.6 - 3.4   POC LYMPH PERCENT 27.2 10 - 50 %L   MID (cbc) 0.2 0 - 0.9   POC MID % 3.4 0 - 12 %M   POC Granulocyte 4.3 2 - 6.9   Granulocyte percent 69.4 37 - 80 %G   RBC 4.26 4.04 - 5.48 M/uL   Hemoglobin 12.8 12.2 - 16.2 g/dL   HCT, POC 65.7 84.6 - 47.9 %   MCV 96.0 80 - 97 fL   MCH, POC 30.2 27 - 31.2 pg   MCHC 31.4 (A) 31.8 - 35.4 g/dL   RDW, POC 96.2 %   Platelet Count, POC 245 142 - 424 K/uL   MPV 8.4 0 - 99.8 fL  POCT urinalysis dipstick  Result Value Ref Range   Color, UA yellow yellow   Clarity, UA clear clear   Glucose, UA negative negative mg/dL   Bilirubin, UA negative negative   Ketones, POC UA negative negative mg/dL   Spec Grav, UA 9.528 4.132 - 1.025   Blood, UA negative negative   pH, UA 6.0 5.0 - 8.0   Protein Ur, POC negative negative mg/dL   Urobilinogen, UA 0.2 0.2 or 1.0 E.U./dL   Nitrite, UA Negative Negative   Leukocytes, UA Negative Negative  POCT urine pregnancy  Result Value Ref Range   Preg Test, Ur Negative Negative      ASSESSMENT AND PLAN:  Tamara Guerra was seen today for needing labs  drawn.  Diagnoses and all orders for this visit:  Nausea and vomiting, intractability of vomiting not specified, unspecified vomiting type -     EKG 12-Lead  Acidosis: Resolving. Patient has eliminated alcohol, has been hydrating, and using bicarbonate.  I think the initial episode, per the previous encounter, was brought about by a combination of dehydration, EtOH consumption, and increased metabolic demand 2/2 to  the heat.  Her EKG is perfect.  Her urine is normal.  Stopping bicarb and will recheck her CMP in the office in about 2-3 weeks.  I think that if her acidosis has resolved then it was previously most likely secondary to excessive EtOH consumption. I have tried to stress the importance of EtOH cessation at this time.  -     Cancel: CMP and Liver -     CMP and Liver -     POCT CBC -     POCT urinalysis dipstick    The patient is advised to call or return to clinic if she does not see an improvement in symptoms, or to seek the care of the closest emergency department if she worsens with the above plan.   Deliah BostonMichael Eshaal Duby, MHS, PA-C [facility] at [facility] 08/19/2017 4:43 PM

## 2017-08-19 NOTE — Patient Instructions (Addendum)
Continue the B vitamin and  Immodium. No alcohol or Zantac   IF you received an x-ray today, you will receive an invoice from [facility]. Please contact [facility] at [phone] with questions or concerns regarding your invoice.   IF you received labwork today, you will receive an invoice from [facility]. Please contact [facility] at [phone] with questions or concerns regarding your invoice.   Our billing staff will not be able to assist you with questions regarding bills from these companies.  You will be contacted with the lab results as soon as they are available. The fastest way to get your results is to activate your My Chart account. Instructions are located on the last page of this paperwork. If you have not heard from us regarding the results in 2 weeks, please contact this office.

## 2017-08-20 ENCOUNTER — Encounter: Payer: Self-pay | Admitting: Physician Assistant

## 2017-08-20 LAB — CMP AND LIVER
ALBUMIN: 4.9 g/dL (ref 3.5–5.5)
ALT: 26 IU/L (ref 0–32)
AST: 22 IU/L (ref 0–40)
Alkaline Phosphatase: 34 IU/L — ABNORMAL LOW (ref 39–117)
BILIRUBIN TOTAL: 0.3 mg/dL (ref 0.0–1.2)
BILIRUBIN, DIRECT: 0.1 mg/dL (ref 0.00–0.40)
BUN: 7 mg/dL (ref 6–20)
CHLORIDE: 100 mmol/L (ref 96–106)
CO2: 22 mmol/L (ref 20–29)
Calcium: 9 mg/dL (ref 8.7–10.2)
Creatinine, Ser: 0.75 mg/dL (ref 0.57–1.00)
GFR calc Af Amer: 118 mL/min/{1.73_m2} (ref >59)
GFR, EST NON AFRICAN AMERICAN: 102 mL/min/{1.73_m2} (ref >59)
Glucose: 88 mg/dL (ref 65–99)
Potassium: 3.5 mmol/L (ref 3.5–5.2)
Sodium: 137 mmol/L (ref 134–144)
Total Protein: 7.5 g/dL (ref 6.0–8.5)

## 2017-08-20 LAB — LACTIC ACID, PLASMA: Lactate, Ven: 9.4 mg/dL (ref 4.8–25.7)

## 2017-09-07 ENCOUNTER — Encounter: Payer: Self-pay | Admitting: Physician Assistant

## 2017-09-08 ENCOUNTER — Telehealth: Payer: Self-pay | Admitting: Physician Assistant

## 2017-09-08 NOTE — Telephone Encounter (Signed)
Please advise 

## 2017-09-08 NOTE — Telephone Encounter (Signed)
Copied from CRM [phone]#[id number]. Topic: Quick Communication - See Telephone Encounter >> Sep 08, 2017  3:22 PM Waymon AmatoBurton, Donna F wrote: Pt is needing a follow up on her lab work and was wanting to know if Chestine SporeClark could put the orders in her chart so she can come in and do a nurse visit   Best number [phone]

## 2017-09-08 NOTE — Telephone Encounter (Signed)
Please put in for a future CMP. Dx fatigue. Please call patient to let her know she can come in.

## 2017-09-09 NOTE — Addendum Note (Signed)
Addended by: Baldwin CrownJOHNSON, Melaney Tellefsen D on: 09/09/2017 08:22 AM   Modules accepted: Orders

## 2017-09-15 ENCOUNTER — Telehealth: Payer: Self-pay | Admitting: Physician Assistant

## 2017-09-15 NOTE — Telephone Encounter (Signed)
I left a message for [name] to schedule an appointment for a LAB ONLY visit. Tamara Guerra has added additional lab work for her. Shay 09-15-17

## 2020-05-21 ENCOUNTER — Emergency Department (HOSPITAL_BASED_OUTPATIENT_CLINIC_OR_DEPARTMENT_OTHER): Payer: PRIVATE HEALTH INSURANCE

## 2020-05-21 ENCOUNTER — Other Ambulatory Visit: Payer: Self-pay

## 2020-05-21 ENCOUNTER — Emergency Department (HOSPITAL_BASED_OUTPATIENT_CLINIC_OR_DEPARTMENT_OTHER)
Admission: EM | Admit: 2020-05-21 | Discharge: 2020-05-21 | Disposition: A | Discharge status: Home / Self Care | Payer: PRIVATE HEALTH INSURANCE | Attending: Emergency Medicine | Admitting: Emergency Medicine

## 2020-05-21 ENCOUNTER — Encounter (HOSPITAL_BASED_OUTPATIENT_CLINIC_OR_DEPARTMENT_OTHER): Payer: Self-pay | Admitting: *Deleted

## 2020-05-21 DIAGNOSIS — S20212A Contusion of left front wall of thorax, initial encounter: Secondary | ICD-10-CM | POA: Insufficient documentation

## 2020-05-21 DIAGNOSIS — S8002XA Contusion of left knee, initial encounter: Secondary | ICD-10-CM | POA: Insufficient documentation

## 2020-05-21 DIAGNOSIS — Y92411 Interstate highway as the place of occurrence of the external cause: Secondary | ICD-10-CM | POA: Insufficient documentation

## 2020-05-21 DIAGNOSIS — S20219A Contusion of unspecified front wall of thorax, initial encounter: Secondary | ICD-10-CM

## 2020-05-21 DIAGNOSIS — S301XXA Contusion of abdominal wall, initial encounter: Secondary | ICD-10-CM | POA: Diagnosis not present

## 2020-05-21 DIAGNOSIS — F17210 Nicotine dependence, cigarettes, uncomplicated: Secondary | ICD-10-CM | POA: Diagnosis not present

## 2020-05-21 DIAGNOSIS — S299XXA Unspecified injury of thorax, initial encounter: Secondary | ICD-10-CM | POA: Diagnosis present

## 2020-05-21 LAB — COMPREHENSIVE METABOLIC PANEL
ALT: 36 U/L (ref 0–44)
AST: 46 U/L — ABNORMAL HIGH (ref 15–41)
Albumin: 4.5 g/dL (ref 3.5–5.0)
Alkaline Phosphatase: 49 U/L (ref 38–126)
Anion gap: 10 (ref 5–15)
BUN: 12 mg/dL (ref 6–20)
CO2: 27 mmol/L (ref 22–32)
Calcium: 9.5 mg/dL (ref 8.9–10.3)
Chloride: 98 mmol/L (ref 98–111)
Creatinine, Ser: 0.72 mg/dL (ref 0.44–1.00)
GFR, Estimated: >60 mL/min (ref >60)
Glucose, Bld: 92 mg/dL (ref 70–99)
Potassium: 3.7 mmol/L (ref 3.5–5.1)
Sodium: 135 mmol/L (ref 135–145)
Total Bilirubin: 0.2 mg/dL — ABNORMAL LOW (ref 0.3–1.2)
Total Protein: 8.6 g/dL — ABNORMAL HIGH (ref 6.5–8.1)

## 2020-05-21 LAB — CBC WITH DIFFERENTIAL/PLATELET
Abs Immature Granulocytes: 0.03 10*3/uL (ref 0.00–0.07)
Basophils Absolute: 0.1 10*3/uL (ref 0.0–0.1)
Basophils Relative: 1 %
Eosinophils Absolute: 0.2 10*3/uL (ref 0.0–0.5)
Eosinophils Relative: 2 %
HCT: 45.4 % (ref 36.0–46.0)
Hemoglobin: 15.8 g/dL — ABNORMAL HIGH (ref 12.0–15.0)
Immature Granulocytes: 0 %
Lymphocytes Relative: 19 %
Lymphs Abs: 2.2 10*3/uL (ref 0.7–4.0)
MCH: 32.6 pg (ref 26.0–34.0)
MCHC: 34.8 g/dL (ref 30.0–36.0)
MCV: 93.6 fL (ref 80.0–100.0)
Monocytes Absolute: 0.8 10*3/uL (ref 0.1–1.0)
Monocytes Relative: 7 %
Neutro Abs: 8.3 10*3/uL — ABNORMAL HIGH (ref 1.7–7.7)
Neutrophils Relative %: 71 %
Platelets: 376 10*3/uL (ref 150–400)
RBC: 4.85 MIL/uL (ref 3.87–5.11)
RDW: 11.9 % (ref 11.5–15.5)
WBC: 11.6 10*3/uL — ABNORMAL HIGH (ref 4.0–10.5)
nRBC: 0 % (ref 0.0–0.2)

## 2020-05-21 MED ORDER — KETOROLAC TROMETHAMINE 30 MG/ML IJ SOLN
30.0000 mg | Freq: Once | INTRAMUSCULAR | Status: AC
  Administered 2020-05-21: 30 mg via INTRAVENOUS
  Filled 2020-05-21: qty 1

## 2020-05-21 MED ORDER — IOHEXOL 300 MG/ML  SOLN
100.0000 mL | Freq: Once | INTRAMUSCULAR | Status: AC | PRN
  Administered 2020-05-21: 100 mL via INTRAVENOUS
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
